# Patient Record
Sex: Female | Born: 1940 | Race: White | Hispanic: No | Marital: Married | Smoking: Former smoker
Health system: Southern US, Community
[De-identification: ages and names within clinical notes are randomized; demographics above are authoritative.]

## PROBLEM LIST (undated history)

## (undated) DIAGNOSIS — E079 Disorder of thyroid, unspecified: Secondary | ICD-10-CM

## (undated) DIAGNOSIS — K8020 Calculus of gallbladder without cholecystitis without obstruction: Secondary | ICD-10-CM

## (undated) DIAGNOSIS — E785 Hyperlipidemia, unspecified: Secondary | ICD-10-CM

## (undated) DIAGNOSIS — I10 Essential (primary) hypertension: Secondary | ICD-10-CM

## (undated) HISTORY — DX: Hyperlipidemia, unspecified: E78.5

## (undated) HISTORY — DX: Calculus of gallbladder without cholecystitis without obstruction: K80.20

## (undated) HISTORY — PX: CORONARY ANGIOPLASTY WITH STENT PLACEMENT: SHX49

---

## 2010-11-06 ENCOUNTER — Emergency Department (HOSPITAL_BASED_OUTPATIENT_CLINIC_OR_DEPARTMENT_OTHER)
Admission: EM | Admit: 2010-11-06 | Discharge: 2010-11-06 | Disposition: A | Discharge status: Home / Self Care | Payer: Medicare Other | Attending: Emergency Medicine | Admitting: Emergency Medicine

## 2010-11-06 ENCOUNTER — Emergency Department (INDEPENDENT_AMBULATORY_CARE_PROVIDER_SITE_OTHER): Payer: Medicare Other

## 2010-11-06 DIAGNOSIS — Z79899 Other long term (current) drug therapy: Secondary | ICD-10-CM | POA: Insufficient documentation

## 2010-11-06 DIAGNOSIS — I10 Essential (primary) hypertension: Secondary | ICD-10-CM | POA: Insufficient documentation

## 2010-11-06 DIAGNOSIS — W010XXA Fall on same level from slipping, tripping and stumbling without subsequent striking against object, initial encounter: Secondary | ICD-10-CM | POA: Insufficient documentation

## 2010-11-06 DIAGNOSIS — T148XXA Other injury of unspecified body region, initial encounter: Secondary | ICD-10-CM

## 2010-11-06 DIAGNOSIS — M79609 Pain in unspecified limb: Secondary | ICD-10-CM

## 2010-11-06 DIAGNOSIS — W19XXXA Unspecified fall, initial encounter: Secondary | ICD-10-CM

## 2010-11-06 HISTORY — DX: Essential (primary) hypertension: I10

## 2010-11-06 NOTE — ED Provider Notes (Signed)
History     CSN: [id number] Arrival date & time: 11/06/2010 10:28 AM   First MD Initiated Contact with Patient 11/06/10 1104      Chief Complaint  Patient presents with  . Fall    (Consider location/radiation/quality/duration/timing/severity/associated sxs/prior treatment) Patient is a 70 y.o. female presenting with fall. The history is provided by the patient.  Fall Incident onset: 6 days ago. The fall occurred while walking (tripped over the dog and hit her arm on something). Distance fallen: ground level. She landed on grass. Point of impact: right arm. Pain location: right arm. The pain is at a severity of 1/10. The pain is mild. She was ambulatory at the scene. Pertinent negatives include no numbness and no tingling. Exacerbated by: nothing. She has tried ice for the symptoms. The treatment provided moderate relief.    Past Medical History  Diagnosis Date  . Hypertension     Past Surgical History  Procedure Date  . Coronary angioplasty with stent placement in 2002    History reviewed. No pertinent family history.  History  Substance Use Topics  . Smoking status: Former Smoker    Quit date: 10/06/1995  . Smokeless tobacco: Never Used  . Alcohol Use: No    OB History    Grav Para Term Preterm Abortions TAB SAB Ect Mult Living                  Review of Systems  Neurological: Negative for tingling and numbness.  All other systems reviewed and are negative.    Allergies  Review of patient's allergies indicates no known allergies.  Home Medications   Current Outpatient Rx  Name Route Sig Dispense Refill  . ASPIRIN 81 MG PO CHEW Oral Chew 81 mg by mouth daily.      Marland Kitchen HYDROCHLOROTHIAZIDE 25 MG PO TABS Oral Take 12.5 mg by mouth daily.      Marland Kitchen LEVOTHYROXINE SODIUM 50 MCG PO TABS Oral Take 50 mcg by mouth daily.      Marland Kitchen LORAZEPAM 1 MG PO TABS Oral Take 0.5 mg by mouth at bedtime as needed. For sleep     . METOPROLOL TARTRATE 50 MG PO TABS Oral Take 50 mg by  mouth 2 (two) times daily.      Marland Kitchen ROSUVASTATIN CALCIUM 10 MG PO TABS Oral Take 10 mg by mouth.        BP 183/83  Pulse 72  Temp(Src) 97.8 F (36.6 C) (Oral)  Resp 20  SpO2 98%  Physical Exam  Nursing note and vitals reviewed. Constitutional: She is oriented to person, place, and time. She appears well-developed and well-nourished. No distress.  HENT:  Head: Normocephalic and atraumatic.  Eyes: EOM are normal. Pupils are equal, round, and reactive to light.  Musculoskeletal: Normal range of motion.       Right elbow: no tenderness found.       Arms: Neurological: She is alert and oriented to person, place, and time.  Skin: Skin is warm and dry. No rash noted.  Psychiatric: She has a normal mood and affect. Her behavior is normal.    ED Course  Procedures (including critical care time)  Labs Reviewed - No data to display Dg Forearm Right  11/06/2010  *RADIOLOGY REPORT*  Clinical Data: Larey Seat several days ago with pain  RIGHT FOREARM - 2 VIEW  Comparison: None.  Findings: The radius and ulna are in normal position. Slight irregularity of the distal right radius may be due to prior trauma  with healing with some degenerative change at the radiocarpal joint space as well.  No acute fracture is seen.  No elbow joint effusion is noted.  IMPRESSION: Negative.  Original Report Authenticated By: Juline Patch, M.D.     1. Hematoma       MDM   Pt with hematoma and large area of ecchymosis over the right forearm.  No pain over the elbow, wrist or shoulder.  Films neg.  Findings discussed with pt.        Gwyneth Sprout, MD 11/06/10 1404

## 2010-11-06 NOTE — ED Notes (Signed)
Pt fell on Tuesday October 30th injuring right forearm. Pt has bruised area approximately 2 inches by 3 inches. Has gold ball sized area of swelling.  States that she put ice on the area.  Denies any difficulty using arm.  Has full mobility of fingers.

## 2014-07-09 DIAGNOSIS — E785 Hyperlipidemia, unspecified: Secondary | ICD-10-CM | POA: Insufficient documentation

## 2014-07-09 DIAGNOSIS — R079 Chest pain, unspecified: Secondary | ICD-10-CM | POA: Insufficient documentation

## 2015-04-29 DIAGNOSIS — E785 Hyperlipidemia, unspecified: Secondary | ICD-10-CM | POA: Insufficient documentation

## 2015-04-29 DIAGNOSIS — Z0000 Encounter for general adult medical examination without abnormal findings: Secondary | ICD-10-CM | POA: Insufficient documentation

## 2015-04-29 DIAGNOSIS — F5104 Psychophysiologic insomnia: Secondary | ICD-10-CM | POA: Insufficient documentation

## 2017-01-19 ENCOUNTER — Other Ambulatory Visit: Payer: Self-pay

## 2017-01-19 ENCOUNTER — Encounter (HOSPITAL_BASED_OUTPATIENT_CLINIC_OR_DEPARTMENT_OTHER): Payer: Self-pay | Admitting: Emergency Medicine

## 2017-01-19 ENCOUNTER — Emergency Department (HOSPITAL_BASED_OUTPATIENT_CLINIC_OR_DEPARTMENT_OTHER)
Admission: EM | Admit: 2017-01-19 | Discharge: 2017-01-19 | Disposition: A | Discharge status: Home / Self Care | Payer: Medicare Other | Attending: Emergency Medicine | Admitting: Emergency Medicine

## 2017-01-19 ENCOUNTER — Emergency Department (HOSPITAL_BASED_OUTPATIENT_CLINIC_OR_DEPARTMENT_OTHER): Payer: Medicare Other

## 2017-01-19 DIAGNOSIS — Z955 Presence of coronary angioplasty implant and graft: Secondary | ICD-10-CM | POA: Insufficient documentation

## 2017-01-19 DIAGNOSIS — Z79899 Other long term (current) drug therapy: Secondary | ICD-10-CM | POA: Insufficient documentation

## 2017-01-19 DIAGNOSIS — M545 Low back pain, unspecified: Secondary | ICD-10-CM

## 2017-01-19 DIAGNOSIS — R1032 Left lower quadrant pain: Secondary | ICD-10-CM | POA: Insufficient documentation

## 2017-01-19 DIAGNOSIS — I10 Essential (primary) hypertension: Secondary | ICD-10-CM | POA: Diagnosis not present

## 2017-01-19 DIAGNOSIS — Z7982 Long term (current) use of aspirin: Secondary | ICD-10-CM | POA: Insufficient documentation

## 2017-01-19 DIAGNOSIS — Z87891 Personal history of nicotine dependence: Secondary | ICD-10-CM | POA: Insufficient documentation

## 2017-01-19 HISTORY — DX: Disorder of thyroid, unspecified: E07.9

## 2017-01-19 MED ORDER — HYDROCODONE-ACETAMINOPHEN 5-325 MG PO TABS
1.0000 | ORAL_TABLET | Freq: Once | ORAL | Status: AC
  Administered 2017-01-19: 1 via ORAL
  Filled 2017-01-19: qty 1

## 2017-01-19 MED ORDER — LIDOCAINE 5 % EX PTCH: 1.0000 | MEDICATED_PATCH | CUTANEOUS | 0 refills | Status: DC

## 2017-01-19 MED ORDER — HYDROCODONE-ACETAMINOPHEN 5-325 MG PO TABS: 1.0000 | ORAL_TABLET | Freq: Four times a day (QID) | ORAL | 0 refills | Status: DC | PRN

## 2017-01-19 MED ORDER — ACETAMINOPHEN 325 MG PO TABS
650.0000 mg | ORAL_TABLET | Freq: Once | ORAL | Status: DC
  Filled 2017-01-19 (×2): qty 2

## 2017-01-19 MED ORDER — DICLOFENAC SODIUM 1 % TD GEL: 2.0000 g | Freq: Four times a day (QID) | TRANSDERMAL | 0 refills | Status: DC

## 2017-01-19 NOTE — ED Notes (Signed)
Patient transported to X-ray 

## 2017-01-19 NOTE — ED Triage Notes (Signed)
L lower back pain radiating down L leg x 3 days. Denies injury.  

## 2017-01-19 NOTE — ED Provider Notes (Signed)
[facility] EMERGENCY DEPARTMENT Provider Note   CSN: [id number] Arrival date & time: 01/19/17  1321     History   Chief Complaint Chief Complaint  Patient presents with  . Back Pain    HPI Sandy Summers is a 77 y.o. female.  HPI   77 year old female presents today with complaints of lower back pain.  Patient notes symptoms started 2 days ago with left lower back pain.  She denies any preceding symptoms, denies any trauma, heavy lifting, or increased activity.  Patient notes this is located in the left lower lumbar region and upper gluteus.  She notes that sitting down makes symptoms worse, she notes that she has improvement in symptoms with standing and ambulation.  She notes that when she ambulates that she feels as if her leg he is going to give out from time to time, denies any loss of distal sensation strength and motor function.  Patient denies any abdominal pain, chest pain, shortness of breath, fever, dysuria.  Patient notes she had similar symptoms approximately 1 month ago that went away without intervention.  Patient reports she has not tried any medications for this.  Patient notes approximately 5 years ago she suffered a compression fracture to her lower back.  Past Medical History:  Diagnosis Date  . Hypertension   . Thyroid disease     There are no active problems to display for this patient.   Past Surgical History:  Procedure Laterality Date  . CORONARY ANGIOPLASTY WITH STENT PLACEMENT  in 2002    OB History    No data available       Home Medications    Prior to Admission medications   Medication Sig Start Date End Date Taking? Authorizing Provider  hydrochlorothiazide (HYDRODIURIL) 25 MG tablet Take 12.5 mg by mouth daily.     Yes [provider]  levothyroxine (SYNTHROID, LEVOTHROID) 50 MCG tablet Take 50 mcg by mouth daily.     Yes [provider]  lisinopril (PRINIVIL,ZESTRIL) 20 MG tablet Take 5 mg by mouth daily.     Yes [provider]  LORazepam (ATIVAN) 1 MG tablet Take 0.5 mg by mouth at bedtime as needed. For sleep    Yes [provider]  metoprolol (LOPRESSOR) 50 MG tablet Take 50 mg by mouth 2 (two) times daily.     Yes [provider]  rosuvastatin (CRESTOR) 10 MG tablet Take 10 mg by mouth.     Yes [provider]  aspirin 81 MG chewable tablet Chew 81 mg by mouth daily.      [provider]  diclofenac sodium (VOLTAREN) 1 % GEL Apply 2 g topically 4 (four) times daily. 01/19/17   Shavaughn Seidl, Tinnie Gens, PA-C  HYDROcodone-acetaminophen (NORCO/VICODIN) 5-325 MG tablet Take 1 tablet by mouth every 6 (six) hours as needed. 01/19/17   Anelly Samarin, Tinnie Gens, PA-C  lidocaine (LIDODERM) 5 % Place 1 patch onto the skin daily. Remove & Discard patch within 12 hours or as directed by MD 01/19/17   Eyvonne Mechanic, PA-C    Family History No family history on file.  Social History Social History   Tobacco Use  . Smoking status: Former Smoker    Last attempt to quit: 10/06/1995    Years since quitting: 21.3  . Smokeless tobacco: Never Used  Substance Use Topics  . Alcohol use: No  . Drug use: No     Allergies   Patient has no known allergies.   Review of Systems Review  of Systems  All other systems reviewed and are negative.    Physical Exam Updated Vital Signs BP (!) 154/80 (BP Location: Left Arm)   Pulse 78   Temp 98.5 F (36.9 C) (Oral)   Resp 18   Ht 4\' 11"  (1.499 m)   Wt 49 kg (108 lb)   SpO2 99%   BMI 21.81 kg/m   Physical Exam  Constitutional: She is oriented to person, place, and time. She appears well-developed and well-nourished.  HENT:  Head: Normocephalic and atraumatic.  Eyes: Conjunctivae are normal. Pupils are equal, round, and reactive to light. Right eye exhibits no discharge. Left eye exhibits no discharge. No scleral icterus.  Neck: Normal range of motion. No JVD present. No tracheal deviation present.  Pulmonary/Chest: Effort  normal. No stridor.  Abdominal: Soft. She exhibits no distension and no mass. There is no tenderness. There is no rebound and no guarding. No hernia.  Musculoskeletal:  No CT or L-spine tenderness, tenderness palpation of left lateral lumbar region and upper gluteus-full active range of motion of bilateral lower extremities, sensation intact, strength 5 out of 5, patellar reflexes 2+ bilateral  Neurological: She is alert and oriented to person, place, and time. Coordination normal.  Psychiatric: She has a normal mood and affect. Her behavior is normal. Judgment and thought content normal.  Nursing note and vitals reviewed.    ED Treatments / Results  Labs (all labs ordered are listed, but only abnormal results are displayed) Labs Reviewed - No data to display  EKG  EKG Interpretation None       Radiology Dg Lumbar Spine Complete  Result Date: 01/19/2017 CLINICAL DATA:  Left lower back pain for 3 days. History of compression fracture. EXAM: LUMBAR SPINE - COMPLETE 4+ VIEW COMPARISON:  MRI of the lumbosacral spine dated 04/25/2012 is not available for review. FINDINGS: Five non rib-bearing vertebral bodies. Age-indeterminate compression deformity of L4 vertebral body with approximately 20% height loss. 4 mm anterolisthesis of L4 on L5. Multilevel osteoarthritic changes with moderate joint space narrowing, endplate sclerosis and remodeling of vertebral bodies, and advanced posterior facet arthropathy at all lumbosacral levels. 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 IMPRESSION: Age-indeterminate compression deformity of L4 vertebral body with approximately 20% height loss. 4 mm anterolisthesis of L4 on L5. 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 Electronically Signed   By: Ted Mcalpineobrinka  Dimitrova M.D.   On: 01/19/2017 16:40    Procedures Procedures (including critical care time)  Medications Ordered in ED Medications  acetaminophen (TYLENOL) tablet 650 mg  (650 mg Oral Not Given 01/19/17 1605)  HYDROcodone-acetaminophen (NORCO/VICODIN) 5-325 MG per tablet 1 tablet (not administered)     Initial Impression / Assessment and Plan / ED Course  I have reviewed the triage vital signs and the nursing notes.  Pertinent labs & imaging results that were available during my care of the patient were reviewed by me and considered in my medical decision making (see chart for details).      Final Clinical Impressions(s) / ED Diagnoses   Final diagnoses:  Acute left-sided low back pain without sciatica    Labs:   Imaging: DG Lumbar  Consults:  Therapeutics:   Discharge Meds: lidoderm, Voltaren gel, norco   Assessment/Plan: 77 year old female presents today with lower back pain.  Patient has no acute findings on her plain films here.  She has no distal neurological deficits, no abdominal pain or systemic symptoms.  Patient will be treated symptomatically, encouraged follow-up with the primary care  for reevaluation and given strict return precautions.  She verbalized understanding and agreement to today's plan had no further questions or concerns.   ED Discharge Orders        Ordered    HYDROcodone-acetaminophen (NORCO/VICODIN) 5-325 MG tablet  Every 6 hours PRN     01/19/17 1659    lidocaine (LIDODERM) 5 %  Every 24 hours     01/19/17 1659    diclofenac sodium (VOLTAREN) 1 % GEL  4 times daily     01/19/17 1659       Shernell Saldierna, Tinnie Gens, PA-C 01/19/17 1701    Gwyneth Sprout, MD 01/21/17 1531

## 2017-01-19 NOTE — Discharge Instructions (Signed)
Please read attached information. If you experience any new or worsening signs or symptoms please return to the emergency room for evaluation. Please follow-up with your primary care provider or specialist as discussed. Please use medication prescribed only as directed and discontinue taking if you have any concerning signs or symptoms.   °

## 2017-06-11 DIAGNOSIS — K5730 Diverticulosis of large intestine without perforation or abscess without bleeding: Secondary | ICD-10-CM | POA: Insufficient documentation

## 2019-01-28 ENCOUNTER — Ambulatory Visit: Payer: PRIVATE HEALTH INSURANCE

## 2019-02-06 ENCOUNTER — Ambulatory Visit: Payer: Medicare Other | Attending: Internal Medicine

## 2019-02-06 DIAGNOSIS — Z23 Encounter for immunization: Secondary | ICD-10-CM | POA: Insufficient documentation

## 2019-02-06 NOTE — Progress Notes (Signed)
   Covid-19 Vaccination Clinic  Name:  AANYA HAYNES    MRN: [id number] DOB: 03-16-1940  02/06/2019  Ms. Ditullio was observed post Covid-19 immunization for 15 minutes without incidence. She was provided with Vaccine Information Sheet and instruction to access the V-Safe system.   Ms. Yaeger was instructed to call 911 with any severe reactions post vaccine: Marland Kitchen Difficulty breathing  . Swelling of your face and throat  . A fast heartbeat  . A bad rash all over your body  . Dizziness and weakness    Immunizations Administered    Name Date Dose VIS Date Route   Pfizer COVID-19 Vaccine 02/06/2019  9:13 AM 0.3 mL 12/12/2018 Intramuscular   Manufacturer: [employer], [employer]   Lot: [id number]   NDC: [address]-0

## 2019-03-03 ENCOUNTER — Ambulatory Visit: Payer: Medicare Other | Attending: Internal Medicine

## 2019-03-03 DIAGNOSIS — Z23 Encounter for immunization: Secondary | ICD-10-CM | POA: Insufficient documentation

## 2019-03-03 NOTE — Progress Notes (Signed)
   Covid-19 Vaccination Clinic  Name:  Sandy Summers    MRN: [id number] DOB: [birth year]  03/03/2019  [name] was observed post Covid-19 immunization for 15 minutes without incident. She was provided with Vaccine Information Sheet and instruction to access the V-Safe system.   [name] was instructed to call 911 with any severe reactions post vaccine: Marland Kitchen Difficulty breathing  . Swelling of face and throat  . A fast heartbeat  . A bad rash all over body  . Dizziness and weakness   Immunizations Administered    Name Date Dose VIS Date Route   Pfizer COVID-19 Vaccine 03/03/2019  9:43 AM 0.3 mL 12/12/2018 Intramuscular   Manufacturer: [employer], [employer]   Lot: [id number]   NDC: [address]-0

## 2019-03-20 ENCOUNTER — Ambulatory Visit (INDEPENDENT_AMBULATORY_CARE_PROVIDER_SITE_OTHER): Payer: Medicare Other | Admitting: Physician Assistant

## 2019-03-20 ENCOUNTER — Encounter: Payer: Self-pay | Admitting: Physician Assistant

## 2019-03-20 ENCOUNTER — Other Ambulatory Visit: Payer: Self-pay

## 2019-03-20 DIAGNOSIS — L309 Dermatitis, unspecified: Secondary | ICD-10-CM | POA: Diagnosis not present

## 2019-03-20 MED ORDER — TRIAMCINOLONE ACETONIDE 0.1 % EX CREA: 1.0000 "application " | TOPICAL_CREAM | Freq: Two times a day (BID) | CUTANEOUS | 2 refills | Status: DC | PRN

## 2019-03-20 NOTE — Progress Notes (Signed)
   New Patient Visit  Subjective  Sandy Summers is a 79 y.o. female who presents for the following: Pruritis (She states that she has all over itching for about 6 months.  PCP put her on Prednisone and she saw improvement for about 2 weeks and then it came back. Also, tried Hydroxyzine but it "knocked her out."  Cannot pinpoint any certain products, does have 2 dogs and 2 cats and her spouse is not itching..Chest has had a rash, lower legs has a rash. It moves around. Some areas have cleared while new areas flare up. Has never been on the face.    Objective  Well appearing patient in no apparent distress; mood and affect are within normal limits.  A focused examination was performed including arms, chest, axillae, abdomen, back, legs and buttocks. Relevant physical exam findings are noted in the Assessment and Plan. No suspicious moles noted on back.  Objective  Left Breast, Left Lower Leg - Anterior, Left Lower Leg - Posterior, Left Upper Back, Right Breast, Right Forearm - Anterior, Right Upper Back: Thin scaly erythematous plaques. Patches noted right wrist, left lower leg and upper back. Entire chest and breasts appear to be erythematous and scaling.  Assessment & Plan  Dermatitis (7) Right Forearm - Anterior; Left Lower Leg - Anterior; Left Lower Leg - Posterior; Left Breast; Right Breast; Left Upper Back; Right Upper Back  She is going to use All free and clear and skip dryer sheets. She is going to stop her lubriderm lotion and use cerave.   triamcinolone cream (KENALOG) 0.1 % - Left Breast, Left Lower Leg - Anterior, Left Lower Leg - Posterior, Left Upper Back, Right Breast, Right Forearm - Anterior, Right Upper Back

## 2019-03-23 ENCOUNTER — Telehealth: Payer: Self-pay | Admitting: Physician Assistant

## 2019-03-23 NOTE — Telephone Encounter (Signed)
Advised pt that metoprolol could make her itch. She has been taking it for years and I advised her not to make any changes at this time. We will see if she improves with other changes and if not she can talk to her PCP about it in the future.

## 2019-04-20 ENCOUNTER — Other Ambulatory Visit: Payer: Self-pay

## 2019-04-20 ENCOUNTER — Ambulatory Visit (INDEPENDENT_AMBULATORY_CARE_PROVIDER_SITE_OTHER): Payer: Medicare Other | Admitting: Physician Assistant

## 2019-04-20 ENCOUNTER — Encounter: Payer: Self-pay | Admitting: Physician Assistant

## 2019-04-20 DIAGNOSIS — L309 Dermatitis, unspecified: Secondary | ICD-10-CM

## 2019-04-20 MED ORDER — BETAMETHASONE DIPROPIONATE 0.05 % EX CREA
TOPICAL_CREAM | Freq: Two times a day (BID) | CUTANEOUS | 11 refills | Status: DC | PRN
Start: 2019-04-20 — End: 2020-11-18

## 2019-04-20 NOTE — Progress Notes (Signed)
   Follow up Visit  Subjective  Sandy Summers is a 79 y.o. female who presents for the following: Follow-up (Patient here today for follow up on dermatitis.  Patient states that she's had a lot of improvement.  There's only one place still itching per patient and that's on her left lower leg.). She no longer itches on her chest or back or arms. She changed laundry detergent. She stopped fabric softener, and dryer sheets and she is using All free and clear.   Objective  Well appearing patient in no apparent distress; mood and affect are within normal limits.  A focused examination was performed including chest, lower legs, arms, back and abdomen. Relevant physical exam findings are noted in the Assessment and Plan.   Objective  Left Lower Leg - Anterior: Xerotic skin noted left lower leg with some erythema and lichenoid papules.  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 She no longer itches anywhere except this left lower leg. She is going to continue to keep her laundry changes and to stay away from lubrider and use cerave. She will do the Betamethasone to her lower leg bid for two weeks with moist wrap and will call if it doesn't improve.  betamethasone dipropionate 0.05 % cream - Left Lower Leg - Anterior  Other Related Medications triamcinolone cream (KENALOG) 0.1 %

## 2019-06-25 DIAGNOSIS — M810 Age-related osteoporosis without current pathological fracture: Secondary | ICD-10-CM | POA: Insufficient documentation

## 2020-04-29 ENCOUNTER — Emergency Department (HOSPITAL_BASED_OUTPATIENT_CLINIC_OR_DEPARTMENT_OTHER): Payer: Medicare Other

## 2020-04-29 ENCOUNTER — Other Ambulatory Visit: Payer: Self-pay

## 2020-04-29 ENCOUNTER — Emergency Department (HOSPITAL_BASED_OUTPATIENT_CLINIC_OR_DEPARTMENT_OTHER)
Admission: EM | Admit: 2020-04-29 | Discharge: 2020-04-29 | Disposition: A | Discharge status: Home / Self Care | Payer: Medicare Other | Attending: Emergency Medicine | Admitting: Emergency Medicine

## 2020-04-29 ENCOUNTER — Encounter (HOSPITAL_BASED_OUTPATIENT_CLINIC_OR_DEPARTMENT_OTHER): Payer: Self-pay | Admitting: Emergency Medicine

## 2020-04-29 DIAGNOSIS — Z79899 Other long term (current) drug therapy: Secondary | ICD-10-CM | POA: Diagnosis not present

## 2020-04-29 DIAGNOSIS — Z951 Presence of aortocoronary bypass graft: Secondary | ICD-10-CM | POA: Diagnosis not present

## 2020-04-29 DIAGNOSIS — K30 Functional dyspepsia: Secondary | ICD-10-CM | POA: Insufficient documentation

## 2020-04-29 DIAGNOSIS — I10 Essential (primary) hypertension: Secondary | ICD-10-CM | POA: Diagnosis not present

## 2020-04-29 DIAGNOSIS — Z87891 Personal history of nicotine dependence: Secondary | ICD-10-CM | POA: Diagnosis not present

## 2020-04-29 DIAGNOSIS — Z7982 Long term (current) use of aspirin: Secondary | ICD-10-CM | POA: Diagnosis not present

## 2020-04-29 LAB — BASIC METABOLIC PANEL
Anion gap: 11 (ref 5–15)
BUN: 27 mg/dL — ABNORMAL HIGH (ref 8–23)
CO2: 23 mmol/L (ref 22–32)
Calcium: 8.8 mg/dL — ABNORMAL LOW (ref 8.9–10.3)
Chloride: 103 mmol/L (ref 98–111)
Creatinine, Ser: 1.39 mg/dL — ABNORMAL HIGH (ref 0.44–1.00)
GFR, Estimated: 39 mL/min — ABNORMAL LOW (ref >60)
Glucose, Bld: 119 mg/dL — ABNORMAL HIGH (ref 70–99)
Potassium: 3.9 mmol/L (ref 3.5–5.1)
Sodium: 137 mmol/L (ref 135–145)

## 2020-04-29 LAB — CBC
HCT: 37.1 % (ref 36.0–46.0)
Hemoglobin: 11.9 g/dL — ABNORMAL LOW (ref 12.0–15.0)
MCH: 30.8 pg (ref 26.0–34.0)
MCHC: 32.1 g/dL (ref 30.0–36.0)
MCV: 96.1 fL (ref 80.0–100.0)
Platelets: 260 10*3/uL (ref 150–400)
RBC: 3.86 MIL/uL — ABNORMAL LOW (ref 3.87–5.11)
RDW: 14.2 % (ref 11.5–15.5)
WBC: 4.4 10*3/uL (ref 4.0–10.5)
nRBC: 0 % (ref 0.0–0.2)

## 2020-04-29 LAB — TROPONIN I (HIGH SENSITIVITY): Troponin I (High Sensitivity): 2 ng/L (ref <18)

## 2020-04-29 NOTE — ED Triage Notes (Signed)
Pt presents to ED POV. Pt c/o severe indigestion. Pt reports that after she was done eating she had sever indigestion aprox 87m and emesis x3-4. Pt had heart stents 20y ago

## 2020-04-29 NOTE — Discharge Instructions (Addendum)
Return if any problems.

## 2020-04-29 NOTE — ED Provider Notes (Signed)
[facility] EMERGENCY DEPARTMENT Provider Note   CSN: [id number] Arrival date & time: 04/29/20  1625     History Chief Complaint  Patient presents with  . Gastroesophageal Reflux    Sandy Summers is a 80 y.o. female.  Pt reports she had an episode of indigestion today.  Pt reports she had several episodes of  Belching and symptoms resolved.  Pt reports she  had a stent placed 20 years ago.  Pt concerned that pain could be from her heart   The history is provided by the patient. No language interpreter was used.  Gastroesophageal Reflux This is a new problem. The current episode started 1 to 2 hours ago. The problem has been resolved. Pertinent negatives include no chest pain and no abdominal pain. Nothing aggravates the symptoms. Nothing relieves the symptoms. She has tried nothing for the symptoms. The treatment provided no relief.       Past Medical History:  Diagnosis Date  . Hypertension   . Thyroid disease     There are no problems to display for this patient.   Past Surgical History:  Procedure Laterality Date  . CORONARY ANGIOPLASTY WITH STENT PLACEMENT  in 2002     OB History   No obstetric history on file.     History reviewed. No pertinent family history.  Social History   Tobacco Use  . Smoking status: Former Smoker    Quit date: 10/06/1995    Years since quitting: 24.5  . Smokeless tobacco: Never Used  Substance Use Topics  . Alcohol use: No  . Drug use: No    Home Medications Prior to Admission medications   Medication Sig Start Date End Date Taking? Authorizing Provider  aspirin 81 MG chewable tablet Chew 81 mg by mouth daily.      [provider]  atorvastatin (LIPITOR) 20 MG tablet Take 20 mg by mouth daily.    [provider]  betamethasone dipropionate 0.05 % cream Apply topically 2 (two) times daily as needed (Rash). 04/20/19   Clark-Burning, Victorino Dike, PA-C  levothyroxine (SYNTHROID, LEVOTHROID) 50 MCG  tablet Take 50 mcg by mouth daily.      [provider]  lisinopril (PRINIVIL,ZESTRIL) 20 MG tablet Take 5 mg by mouth daily.     [provider]  LORazepam (ATIVAN) 1 MG tablet Take 0.5 mg by mouth at bedtime as needed. For sleep     [provider]  metoprolol succinate (TOPROL-XL) 50 MG 24 hr tablet Take 50 mg by mouth at bedtime. 03/01/19   [provider]  triamcinolone cream (KENALOG) 0.1 % Apply 1 application topically 2 (two) times daily as needed. Apply to affected area twice a day as directed. 03/20/19   Clark-Burning, Victorino Dike, PA-C    Allergies    Alendronate, Amoxicillin-pot clavulanate, Denosumab, and Gabapentin  Review of Systems   Review of Systems  Cardiovascular: Negative for chest pain.  Gastrointestinal: Negative for abdominal pain.  All other systems reviewed and are negative.   Physical Exam Updated Vital Signs BP (!) 142/68   Pulse 76   Temp 98.9 F (37.2 C) (Oral)   Resp 16   Ht 4\' 11"  (1.499 m)   Wt 51.1 kg   SpO2 98%   BMI 22.74 kg/m   Physical Exam Vitals and nursing note reviewed.  Constitutional:      Appearance: She is well-developed.  HENT:     Head: Normocephalic.  Cardiovascular:     Rate and Rhythm: Normal  rate and regular rhythm.     Pulses: Normal pulses.  Pulmonary:     Effort: Pulmonary effort is normal.  Abdominal:     General: Abdomen is flat. There is no distension.  Musculoskeletal:        General: Normal range of motion.     Cervical back: Normal range of motion.  Skin:    General: Skin is warm.  Neurological:     General: No focal deficit present.     Mental Status: She is alert and oriented to person, place, and time.  Psychiatric:        Mood and Affect: Mood normal.     ED Results / Procedures / Treatments   Labs (all labs ordered are listed, but only abnormal results are displayed) Labs Reviewed  BASIC METABOLIC PANEL - Abnormal; Notable for the following components:       Result Value   Glucose, Bld 119 (*)    BUN 27 (*)    Creatinine, Ser 1.39 (*)    Calcium 8.8 (*)    GFR, Estimated 39 (*)    All other components within normal limits  CBC - Abnormal; Notable for the following components:   RBC 3.86 (*)    Hemoglobin 11.9 (*)    All other components within normal limits  TROPONIN I (HIGH SENSITIVITY)  TROPONIN I (HIGH SENSITIVITY)    EKG EKG Interpretation  Date/Time:  Friday April 29 2020 16:35:30 EDT Ventricular Rate:  79 PR Interval:  171 QRS Duration: 85 QT Interval:  374 QTC Calculation: 429 R Axis:   -38 Text Interpretation: Sinus rhythm Low voltage, precordial leads Abnormal R-wave progression, late transition Left ventricular hypertrophy Confirmed by Cherlynn Perches ([id number]) on 04/29/2020 4:40:30 PM   Radiology DG Chest 2 View  Result Date: 04/29/2020 CLINICAL DATA:  Severe indigestion. EXAM: CHEST - 2 VIEW COMPARISON:  None. FINDINGS: The heart size and mediastinal contours are within normal limits. Both lungs are clear. The visualized skeletal structures are unremarkable. IMPRESSION: No active cardiopulmonary disease. Electronically Signed   By: Elige Ko   On: 04/29/2020 17:54    Procedures Procedures   Medications Ordered in ED Medications - No data to display  ED Course  I have reviewed the triage vital signs and the nursing notes.  Pertinent labs & imaging results that were available during my care of the patient were reviewed by me and considered in my medical decision making (see chart for details).    MDM Rules/Calculators/A&P                          MDM: EKG no acute abnormality, troponin x1 is negative.  Pt denies any current symptoms.  Pt does not want second troponin.  Pt counseled on symptoms  Final Clinical Impression(s) / ED Diagnoses Final diagnoses:  Indigestion    Rx / DC Orders ED Discharge Orders    None    An After Visit Summary was printed and given to the patient.   Elson Areas,  [place] 04/29/20 1947    Sabino Donovan, MD 04/29/20 2325

## 2020-11-15 ENCOUNTER — Encounter (HOSPITAL_BASED_OUTPATIENT_CLINIC_OR_DEPARTMENT_OTHER): Payer: Self-pay | Admitting: Emergency Medicine

## 2020-11-15 ENCOUNTER — Other Ambulatory Visit: Payer: Self-pay

## 2020-11-15 ENCOUNTER — Emergency Department (HOSPITAL_BASED_OUTPATIENT_CLINIC_OR_DEPARTMENT_OTHER): Payer: Medicare Other

## 2020-11-15 ENCOUNTER — Inpatient Hospital Stay (HOSPITAL_BASED_OUTPATIENT_CLINIC_OR_DEPARTMENT_OTHER)
Admission: EM | Admit: 2020-11-15 | Discharge: 2020-11-18 | DRG: 481 | Disposition: A | Discharge status: Home / Self Care | Payer: Medicare Other | Attending: Internal Medicine | Admitting: Internal Medicine

## 2020-11-15 DIAGNOSIS — D62 Acute posthemorrhagic anemia: Secondary | ICD-10-CM | POA: Diagnosis not present

## 2020-11-15 DIAGNOSIS — Z87891 Personal history of nicotine dependence: Secondary | ICD-10-CM

## 2020-11-15 DIAGNOSIS — Z955 Presence of coronary angioplasty implant and graft: Secondary | ICD-10-CM | POA: Diagnosis not present

## 2020-11-15 DIAGNOSIS — S72002A Fracture of unspecified part of neck of left femur, initial encounter for closed fracture: Secondary | ICD-10-CM | POA: Diagnosis present

## 2020-11-15 DIAGNOSIS — Z880 Allergy status to penicillin: Secondary | ICD-10-CM

## 2020-11-15 DIAGNOSIS — I2510 Atherosclerotic heart disease of native coronary artery without angina pectoris: Secondary | ICD-10-CM | POA: Diagnosis present

## 2020-11-15 DIAGNOSIS — Z79899 Other long term (current) drug therapy: Secondary | ICD-10-CM

## 2020-11-15 DIAGNOSIS — T148XXA Other injury of unspecified body region, initial encounter: Secondary | ICD-10-CM

## 2020-11-15 DIAGNOSIS — W010XXA Fall on same level from slipping, tripping and stumbling without subsequent striking against object, initial encounter: Secondary | ICD-10-CM | POA: Diagnosis present

## 2020-11-15 DIAGNOSIS — D649 Anemia, unspecified: Secondary | ICD-10-CM | POA: Diagnosis present

## 2020-11-15 DIAGNOSIS — Z20822 Contact with and (suspected) exposure to covid-19: Secondary | ICD-10-CM | POA: Diagnosis present

## 2020-11-15 DIAGNOSIS — Z888 Allergy status to other drugs, medicaments and biological substances status: Secondary | ICD-10-CM | POA: Diagnosis not present

## 2020-11-15 DIAGNOSIS — S72012A Unspecified intracapsular fracture of left femur, initial encounter for closed fracture: Principal | ICD-10-CM | POA: Diagnosis present

## 2020-11-15 DIAGNOSIS — K5900 Constipation, unspecified: Secondary | ICD-10-CM | POA: Diagnosis present

## 2020-11-15 DIAGNOSIS — E785 Hyperlipidemia, unspecified: Secondary | ICD-10-CM | POA: Diagnosis present

## 2020-11-15 DIAGNOSIS — Z79890 Hormone replacement therapy (postmenopausal): Secondary | ICD-10-CM | POA: Diagnosis not present

## 2020-11-15 DIAGNOSIS — Z7982 Long term (current) use of aspirin: Secondary | ICD-10-CM | POA: Diagnosis not present

## 2020-11-15 DIAGNOSIS — M810 Age-related osteoporosis without current pathological fracture: Secondary | ICD-10-CM | POA: Diagnosis present

## 2020-11-15 DIAGNOSIS — I10 Essential (primary) hypertension: Secondary | ICD-10-CM | POA: Diagnosis present

## 2020-11-15 DIAGNOSIS — Z66 Do not resuscitate: Secondary | ICD-10-CM | POA: Diagnosis present

## 2020-11-15 DIAGNOSIS — E039 Hypothyroidism, unspecified: Secondary | ICD-10-CM | POA: Diagnosis present

## 2020-11-15 LAB — CBC WITH DIFFERENTIAL/PLATELET
Abs Immature Granulocytes: 0.02 10*3/uL (ref 0.00–0.07)
Basophils Absolute: 0 10*3/uL (ref 0.0–0.1)
Basophils Relative: 0 %
Eosinophils Absolute: 0.2 10*3/uL (ref 0.0–0.5)
Eosinophils Relative: 5 %
HCT: 35.3 % — ABNORMAL LOW (ref 36.0–46.0)
Hemoglobin: 11.5 g/dL — ABNORMAL LOW (ref 12.0–15.0)
Immature Granulocytes: 0 %
Lymphocytes Relative: 13 %
Lymphs Abs: 0.6 10*3/uL — ABNORMAL LOW (ref 0.7–4.0)
MCH: 30.4 pg (ref 26.0–34.0)
MCHC: 32.6 g/dL (ref 30.0–36.0)
MCV: 93.4 fL (ref 80.0–100.0)
Monocytes Absolute: 0.5 10*3/uL (ref 0.1–1.0)
Monocytes Relative: 12 %
Neutro Abs: 3.1 10*3/uL (ref 1.7–7.7)
Neutrophils Relative %: 70 %
Platelets: 275 10*3/uL (ref 150–400)
RBC: 3.78 MIL/uL — ABNORMAL LOW (ref 3.87–5.11)
RDW: 13.9 % (ref 11.5–15.5)
WBC: 4.5 10*3/uL (ref 4.0–10.5)
nRBC: 0 % (ref 0.0–0.2)

## 2020-11-15 LAB — RESP PANEL BY RT-PCR (FLU A&B, COVID) ARPGX2
Influenza A by PCR: NEGATIVE
Influenza B by PCR: NEGATIVE
SARS Coronavirus 2 by RT PCR: NEGATIVE

## 2020-11-15 LAB — BASIC METABOLIC PANEL
Anion gap: 8 (ref 5–15)
BUN: 20 mg/dL (ref 8–23)
CO2: 25 mmol/L (ref 22–32)
Calcium: 8.9 mg/dL (ref 8.9–10.3)
Chloride: 104 mmol/L (ref 98–111)
Creatinine, Ser: 0.84 mg/dL (ref 0.44–1.00)
GFR, Estimated: >60 mL/min (ref >60)
Glucose, Bld: 92 mg/dL (ref 70–99)
Potassium: 4.6 mmol/L (ref 3.5–5.1)
Sodium: 137 mmol/L (ref 135–145)

## 2020-11-15 MED ORDER — ATORVASTATIN CALCIUM 10 MG PO TABS
20.0000 mg | ORAL_TABLET | Freq: Every day | ORAL | Status: DC
  Administered 2020-11-17 – 2020-11-18 (×2): 20 mg via ORAL
  Filled 2020-11-15 (×4): qty 2

## 2020-11-15 MED ORDER — LEVOTHYROXINE SODIUM 50 MCG PO TABS
50.0000 ug | ORAL_TABLET | Freq: Every day | ORAL | Status: DC
  Administered 2020-11-16 – 2020-11-18 (×3): 50 ug via ORAL
  Filled 2020-11-15 (×3): qty 1

## 2020-11-15 MED ORDER — LISINOPRIL 5 MG PO TABS
5.0000 mg | ORAL_TABLET | Freq: Every day | ORAL | Status: DC
  Administered 2020-11-16 – 2020-11-18 (×3): 5 mg via ORAL
  Filled 2020-11-15 (×3): qty 1

## 2020-11-15 MED ORDER — MORPHINE SULFATE (PF) 2 MG/ML IV SOLN: 0.5000 mg | INTRAVENOUS | Status: DC | PRN

## 2020-11-15 MED ORDER — METOPROLOL SUCCINATE ER 50 MG PO TB24
50.0000 mg | ORAL_TABLET | Freq: Every day | ORAL | Status: DC
  Administered 2020-11-15 – 2020-11-17 (×3): 50 mg via ORAL
  Filled 2020-11-15 (×3): qty 1

## 2020-11-15 MED ORDER — HYDROCODONE-ACETAMINOPHEN 5-325 MG PO TABS
1.0000 | ORAL_TABLET | Freq: Four times a day (QID) | ORAL | Status: DC | PRN
  Filled 2020-11-15: qty 1
  Filled 2020-11-15: qty 2

## 2020-11-15 MED ORDER — VITAMIN D3 25 MCG (1000 UNIT) PO TABS
1000.0000 [IU] | ORAL_TABLET | Freq: Every day | ORAL | Status: DC
  Filled 2020-11-15 (×3): qty 1

## 2020-11-15 MED ORDER — SODIUM CHLORIDE 0.9 % IV SOLN: INTRAVENOUS | Status: AC

## 2020-11-15 NOTE — ED Provider Notes (Signed)
[facility] EMERGENCY DEPARTMENT Provider Note   CSN: [id number] Arrival date & time: 11/15/20  4888     History Chief Complaint  Patient presents with   Sandy Summers is a 80 y.o. female.  Patient fell about 5 or 6 days ago and landed on her left gluteal area.  She has a bruise to the left side of her hip.  Has had pain with ambulation since.  Did not hit her head or lose consciousness.  The history is provided by the patient.  Fall This is a new problem. The current episode started more than 2 days ago. The problem occurs daily. The problem has been gradually improving. Pertinent negatives include no chest pain, no abdominal pain, no headaches and no shortness of breath. The symptoms are aggravated by walking. Relieved by: otc meds. The treatment provided mild relief.      Past Medical History:  Diagnosis Date   Hypertension    Thyroid disease     There are no problems to display for this patient.   Past Surgical History:  Procedure Laterality Date   CORONARY ANGIOPLASTY WITH STENT PLACEMENT  in 2002     OB History   No obstetric history on file.     No family history on file.  Social History   Tobacco Use   Smoking status: Former    Types: Cigarettes    Quit date: 10/06/1995    Years since quitting: 25.1   Smokeless tobacco: Never  Substance Use Topics   Alcohol use: No   Drug use: No    Home Medications Prior to Admission medications   Medication Sig Start Date End Date Taking? Authorizing Provider  aspirin 81 MG chewable tablet Chew 81 mg by mouth daily.      [provider]  atorvastatin (LIPITOR) 20 MG tablet Take 20 mg by mouth daily.    [provider]  betamethasone dipropionate 0.05 % cream Apply topically 2 (two) times daily as needed (Rash). 04/20/19   Sandy Summers, Sandy Summers  levothyroxine (SYNTHROID, LEVOTHROID) 50 MCG tablet Take 50 mcg by mouth daily.      [provider]  lisinopril  (PRINIVIL,ZESTRIL) 20 MG tablet Take 5 mg by mouth daily.     [provider]  LORazepam (ATIVAN) 1 MG tablet Take 0.5 mg by mouth at bedtime as needed. For sleep     [provider]  metoprolol succinate (TOPROL-XL) 50 MG 24 hr tablet Take 50 mg by mouth at bedtime. 03/01/19   [provider]  triamcinolone cream (KENALOG) 0.1 % Apply 1 application topically 2 (two) times daily as needed. Apply to affected area twice a day as directed. 03/20/19   Sandy Summers, Sandy Summers    Allergies    Alendronate, Amoxicillin-pot clavulanate, Denosumab, and Gabapentin  Review of Systems   Review of Systems  Respiratory:  Negative for shortness of breath.   Cardiovascular:  Negative for chest pain.  Gastrointestinal:  Negative for abdominal pain.  Musculoskeletal:  Positive for arthralgias. Negative for back pain and gait problem.  Skin:  Positive for color change.  Neurological:  Negative for weakness, numbness and headaches.   Physical Exam Updated Vital Signs BP (!) 148/69 (BP Location: Right Arm)   Pulse 76   Temp 97.9 F (36.6 C) (Oral)   Resp 18   Ht 4\' 11"  (1.499 m)   Wt 48.5 kg   SpO2 100%   BMI 21.61 kg/m   Physical Exam  Constitutional:      General: She is not in acute distress.    Appearance: She is not ill-appearing.  Cardiovascular:     Pulses: Normal pulses.  Musculoskeletal:        General: Tenderness present. Normal range of motion.     Cervical back: Normal range of motion. No tenderness.     Comments: Tenderness over the left lateral hip  Skin:    Findings: Bruising present.     Comments: Bruising over the left trochanter/hip  Neurological:     General: No focal deficit present.     Mental Status: She is alert.     Sensory: No sensory deficit.     Motor: No weakness.     Comments: 5+ out of 5 strength throughout, normal sensation    ED Results / Procedures / Treatments   Labs (all labs ordered are listed, but only abnormal results  are displayed) Labs Reviewed  RESP PANEL BY RT-PCR (FLU A&B, COVID) [id number]  CBC WITH DIFFERENTIAL/PLATELET  BASIC METABOLIC PANEL    EKG None  Radiology DG Chest 1 View  Result Date: 11/15/2020 CLINICAL DATA:  Fall, left hip pain.  Reduced mobility. EXAM: CHEST  1 VIEW COMPARISON:  04/29/2020 FINDINGS: Atherosclerotic calcification of the aortic arch. Mild lower thoracic and upper lumbar spondylosis. The lungs appear clear. Heart size within normal limits. No blunting of the costophrenic angles. IMPRESSION: 1. No acute findings. 2. Atherosclerosis is present, including aortoiliac atherosclerotic disease. Electronically Signed   By: Gaylyn Rong M.D.   On: 11/15/2020 09:27   DG Hip Unilat With Pelvis 2-3 Views Left  Result Date: 11/15/2020 CLINICAL DATA:  Fall last week, left hip pain.  Reduced mobility. EXAM: DG HIP (WITH OR WITHOUT PELVIS) 2-3V LEFT COMPARISON:  Reduced mobility. FINDINGS: Acute mildly displaced subcapital femoral neck fracture on the left. Mild spurring of both acetabula. Atherosclerotic vascular calcifications noted. Bony demineralization is present. IMPRESSION: 1. Acute left subcapital femoral neck fracture. 2. Bilateral acetabular spurring. 3. Aortic Atherosclerosis (ICD10-I70.0). Common iliac artery atherosclerosis. Electronically Signed   By: Gaylyn Rong M.D.   On: 11/15/2020 09:25    Procedures Procedures   Medications Ordered in ED Medications - No data to display  ED Course  I have reviewed the triage vital signs and the nursing notes.  Pertinent labs & imaging results that were available during my care of the patient were reviewed by me and considered in my medical decision making (see chart for details).    MDM Rules/Calculators/A&P                           [name] is here with left hip pain after fall last week.  Bruising to the left hip.  Normal vitals.  She has been ambulatory.  Denies hitting her head or losing consciousness.   She is on a blood thinners.  X-rays do show left acute subcapital fracture of the femoral neck.  Talked with Dr. Sherilyn Dacosta with orthopedics.  We will have her admitted to hospitalist.  Medical screening labs and EKG have been ordered.  She is neuromuscularly neurovascularly intact.  This chart was dictated using voice recognition software.  Despite best efforts to proofread,  errors can occur which can change the documentation meaning.   Final Clinical Impression(s) / ED Diagnoses Final diagnoses:  Closed fracture of left hip, initial encounter [facility])    Rx / DC Orders ED Discharge Orders  None        Virgina Norfolk, DO 11/15/20 [phone]

## 2020-11-15 NOTE — H&P (Signed)
History and Physical    Sandy Summers EMV:[id number] DOB: Feb 17, 1940 DOA: 11/15/2020  PCP: Oneita Hurt, No Patient coming from: [facility] emergency department  Chief Complaint: Fall, left hip pain  HPI: Sandy Summers is a 80 y.o. female with medical history significant of hypertension, hyperlipidemia, hypothyroidism, CAD status post PCI in 2002, osteoporosis presented to the ED complaining of left hip pain after a mechanical fall last week.  Vital signs stable.  Labs showing WBC 4.5, hemoglobin 11.5, platelet count 275k.  Sodium 137, potassium 4.6, chloride 104, bicarb 25, BUN 20, creatinine 0.8, glucose 92.  Screening COVID and influenza PCR negative.  X-ray of left hip/pelvis showing acute left subcapital femoral neck fracture.  Chest x-ray negative for acute finding.  EKG without acute changes. Orthopedics (Dr. Sherilyn Dacosta) consulted, plan for surgery tomorrow.  Patient states she was taking her dogs out of the car and somehow got entangled in their leashes and fell on her left hip.  She did not injure her head.  States prior to this event today, she was doing very well.  She walks 4 miles daily and plays golf.  Denies any chest pain or shortness of breath with exertion.  Denies fevers, cough, nausea, vomiting, abdominal pain, diarrhea, dysuria, hematemesis, hematochezia, or melena.  States she had a colonoscopy done over 10 years ago and was told she had diverticulosis.  Review of Systems:  All systems reviewed and apart from history of presenting illness, are negative.  Past Medical History:  Diagnosis Date   Hypertension    Thyroid disease     Past Surgical History:  Procedure Laterality Date   CORONARY ANGIOPLASTY WITH STENT PLACEMENT  in 2002     reports that she quit smoking about 25 years ago. She has never used smokeless tobacco. She reports that she does not drink alcohol and does not use drugs.  Allergies  Allergen Reactions   Alendronate Nausea Only   Amoxicillin-Pot  Clavulanate Diarrhea   Denosumab Other (See Comments)    I just didn't like it    Gabapentin Other (See Comments)    Ineffective for sleep "made me crazy"    History reviewed. No pertinent family history.  Prior to Admission medications   Medication Sig Start Date End Date Taking? Authorizing Provider  aspirin 81 MG chewable tablet Chew 81 mg by mouth daily.     Yes [provider]  atorvastatin (LIPITOR) 20 MG tablet Take 20 mg by mouth daily.   Yes [provider]  Cholecalciferol 25 MCG (1000 UT) tablet Take 1,000 Units by mouth daily.   Yes [provider]  levothyroxine (SYNTHROID, LEVOTHROID) 50 MCG tablet Take 50 mcg by mouth daily.     Yes [provider]  lisinopril (ZESTRIL) 5 MG tablet Take 5 mg by mouth daily.   Yes [provider]  LORazepam (ATIVAN) 1 MG tablet Take 0.5 mg by mouth at bedtime as needed for anxiety. For sleep   Yes [provider]  metoprolol succinate (TOPROL-XL) 50 MG 24 hr tablet Take 50 mg by mouth at bedtime. 03/01/19  Yes [provider]  Multiple Vitamins-Minerals (PRESERVISION AREDS 2 PO) Take 1 capsule by mouth daily.   Yes [provider]  betamethasone dipropionate 0.05 % cream Apply topically 2 (two) times daily as needed (Rash). Patient not taking: No sig reported 04/20/19   Clark-Burning, Victorino Dike, PA-C  triamcinolone cream (KENALOG) 0.1 % Apply 1 application topically 2 (two) times daily as needed. Apply to  affected area twice a day as directed. Patient not taking: No sig reported 03/20/19   Derenda Mis, [place]    Physical Exam: Vitals:   11/15/20 1354 11/15/20 1521 11/15/20 1826 11/15/20 1900  BP: 140/65 119/64 (!) 155/91 (!) 149/69  Pulse: 67 75 80 73  Resp: 15 16 18 20   Temp:   98.2 F (36.8 C) 98.1 F (36.7 C)  TempSrc:   Oral Oral  SpO2: 100% 98% 98% 97%  Weight:      Height:        Physical Exam Constitutional:      General: She is not in acute  distress. HENT:     Head: Normocephalic and atraumatic.  Eyes:     Extraocular Movements: Extraocular movements intact.     Conjunctiva/sclera: Conjunctivae normal.  Cardiovascular:     Rate and Rhythm: Normal rate and regular rhythm.     Pulses: Normal pulses.  Pulmonary:     Effort: Pulmonary effort is normal. No respiratory distress.     Breath sounds: Normal breath sounds. No wheezing or rales.  Abdominal:     General: Bowel sounds are normal. There is no distension.     Palpations: Abdomen is soft.     Tenderness: There is no abdominal tenderness.  Musculoskeletal:        General: No swelling or tenderness.     Cervical back: Normal range of motion and neck supple.  Skin:    General: Skin is warm and dry.  Neurological:     General: No focal deficit present.     Mental Status: She is alert and oriented to person, place, and time.     Labs on Admission: I have personally reviewed following labs and imaging studies  CBC: Recent Labs  Lab 11/15/20 1008  WBC 4.5  NEUTROABS 3.1  HGB 11.5*  HCT 35.3*  MCV 93.4  PLT 275   Basic Metabolic Panel: Recent Labs  Lab 11/15/20 1008  NA 137  K 4.6  CL 104  CO2 25  GLUCOSE 92  BUN 20  CREATININE 0.84  CALCIUM 8.9   GFR: Estimated Creatinine Clearance: 37 mL/min (by C-G formula based on SCr of 0.84 mg/dL). Liver Function Tests: No results for input(s): AST, ALT, ALKPHOS, BILITOT, PROT, ALBUMIN in the last 168 hours. No results for input(s): LIPASE, AMYLASE in the last 168 hours. No results for input(s): AMMONIA in the last 168 hours. Coagulation Profile: No results for input(s): INR, PROTIME in the last 168 hours. Cardiac Enzymes: No results for input(s): CKTOTAL, CKMB, CKMBINDEX, TROPONINI in the last 168 hours. BNP (last 3 results) No results for input(s): PROBNP in the last 8760 hours. HbA1C: No results for input(s): HGBA1C in the last 72 hours. CBG: No results for input(s): GLUCAP in the last 168  hours. Lipid Profile: No results for input(s): CHOL, HDL, LDLCALC, TRIG, CHOLHDL, LDLDIRECT in the last 72 hours. Thyroid Function Tests: No results for input(s): TSH, T4TOTAL, FREET4, T3FREE, THYROIDAB in the last 72 hours. Anemia Panel: No results for input(s): VITAMINB12, FOLATE, FERRITIN, TIBC, IRON, RETICCTPCT in the last 72 hours. Urine analysis: No results found for: COLORURINE, APPEARANCEUR, LABSPEC, PHURINE, GLUCOSEU, HGBUR, BILIRUBINUR, KETONESUR, PROTEINUR, UROBILINOGEN, NITRITE, LEUKOCYTESUR  Radiological Exams on Admission: DG Chest 1 View  Result Date: 11/15/2020 CLINICAL DATA:  Fall, left hip pain.  Reduced mobility. EXAM: CHEST  1 VIEW COMPARISON:  04/29/2020 FINDINGS: Atherosclerotic calcification of the aortic arch. Mild lower thoracic and upper lumbar spondylosis. The lungs appear clear. Heart  size within normal limits. No blunting of the costophrenic angles. IMPRESSION: 1. No acute findings. 2. Atherosclerosis is present, including aortoiliac atherosclerotic disease. Electronically Signed   By: Gaylyn Rong M.D.   On: 11/15/2020 09:27   DG Hip Unilat With Pelvis 2-3 Views Left  Result Date: 11/15/2020 CLINICAL DATA:  Fall last week, left hip pain.  Reduced mobility. EXAM: DG HIP (WITH OR WITHOUT PELVIS) 2-3V LEFT COMPARISON:  Reduced mobility. FINDINGS: Acute mildly displaced subcapital femoral neck fracture on the left. Mild spurring of both acetabula. Atherosclerotic vascular calcifications noted. Bony demineralization is present. IMPRESSION: 1. Acute left subcapital femoral neck fracture. 2. Bilateral acetabular spurring. 3. Aortic Atherosclerosis (ICD10-I70.0). Common iliac artery atherosclerosis. Electronically Signed   By: Gaylyn Rong M.D.   On: 11/15/2020 09:25    EKG: Independently reviewed.  Sinus rhythm, no acute changes.  Assessment/Plan Principal Problem:   Closed left hip fracture, initial encounter (HCC) Active Problems:   CAD (coronary artery  disease)   Normocytic anemia   Essential hypertension   Hypothyroidism   Acute left subcapital femoral neck fracture -Secondary to mechanical fall -Neurovascularly intact -Orthopedics planning for surgery tomorrow -Keep n.p.o. after midnight -Gentle IV fluid hydration -Pain management: Morphine as needed, Norco as needed -Nonweightbearing -Hold home aspirin  CAD status post PCI in 2002 -Not endorsing any anginal symptoms. -EKG without acute changes. -Echo done in September 2021 (Care Everywhere) showing LVEF 65 to 70% with prolonged relaxation, no wall motion abnormalities, mild to moderate aortic stenosis, trace mitral regurgitation, and trace tricuspid regurgitation. -Patient appears euvolemic.  Chest x-ray negative for acute finding. -Hold home aspirin -Continue metoprolol and Lipitor  Normocytic anemia -Hemoglobin currently 11.5, MCV 93.4.  Hemoglobin was 12.5 on labs done in August 2022 and previously 11.9 in April 2022. -No signs of active bleeding -Not endorsing any symptoms of GI bleed -Anemia panel -FOBT  Hypertension -Stable -Continue metoprolol and lisinopril  Hyperlipidemia -LDL 53 on 08/01/2020 -Continue Lipitor  Hypothyroidism -TSH normal on labs done 08/01/2020 -Continue Synthroid  Osteoporosis -Per note from PCP (Care Everywhere), history of intolerance to alendronate and Prolia and patient did not want to consider Reclast due to side effects. -Continue vitamin D supplement  DVT prophylaxis: SCDs Code Status: Patient wishes to be DNR. Family Communication: No family available at this time. Disposition Plan: Status is: Inpatient  Remains inpatient appropriate because: Needs surgery for hip fracture.  Level of care:  Level of care: Med-Surg  The medical decision making on this patient was of high complexity and the patient is at high risk for clinical deterioration, therefore this is a level 3 visit.  John Giovanni MD [facility]  If  7PM-7AM, please contact night-coverage [email or web address]  11/15/2020, 10:19 PM

## 2020-11-15 NOTE — ED Triage Notes (Signed)
Larey Seat last Thursday when  the dogs got caught in leashes  and landed on left hip  has been self medicating but still hurts

## 2020-11-15 NOTE — ED Notes (Signed)
Pt given snacks , states wants to go home , husband in room awaits ready room

## 2020-11-15 NOTE — ED Notes (Signed)
[facility] here to get pt report called to hailey at [facility], husband made aware of pt leaving [facility]

## 2020-11-15 NOTE — Progress Notes (Signed)
Patient discussed with ED provider at [facility]  Plan to transfer to [facility], admit to Hospitalist service for preoperative clearance, optimization, and risk stratification.  Plan for OR on 11/16/2020 for screw fixation of left valgus impacted femoral neck fracture.  Will see and discuss plan with patient on 11/16/2020 with full consult note to follow.  Ernestina Columbia M.D. Orthopaedic Surgery [facility]

## 2020-11-15 NOTE — ED Notes (Signed)
Assisted up to br

## 2020-11-15 NOTE — ED Notes (Signed)
Pt given chx dinner, husband in room

## 2020-11-15 NOTE — Progress Notes (Signed)
Received a phone call from Facility: [facility]  Requesting MD: Carmie End Patient with h/o HTN and hypothyroidism presenting with L hip pain following a mechanical fall last Thursday.  Imaging shows acute left subcapital femoral neck fracture.  Dr. Sherilyn Dacosta is on for orthopedics.  Possible surgery today, will keep NPO for now. Plan of care: Transfer to [facility] for surgery, possibly as early as today. The patient will be accepted for admission to Med Surg at [facility] when bed is available.    Nursing staff, Please call the [facility] Admits & Consults System-Wide number at the top of Amion at the time of the patient's arrival so that the patient can be paged to the admitting physician.   Georgana Curio, M.D. [facility]

## 2020-11-16 ENCOUNTER — Encounter (HOSPITAL_COMMUNITY)
Admission: EM | Disposition: A | Discharge status: Home / Self Care | Payer: Self-pay | Source: Home / Self Care | Attending: Internal Medicine

## 2020-11-16 ENCOUNTER — Encounter (HOSPITAL_COMMUNITY): Payer: Self-pay | Admitting: Internal Medicine

## 2020-11-16 ENCOUNTER — Inpatient Hospital Stay (HOSPITAL_COMMUNITY): Payer: Medicare Other

## 2020-11-16 DIAGNOSIS — S72002A Fracture of unspecified part of neck of left femur, initial encounter for closed fracture: Secondary | ICD-10-CM

## 2020-11-16 DIAGNOSIS — I10 Essential (primary) hypertension: Secondary | ICD-10-CM

## 2020-11-16 HISTORY — PX: HIP PINNING,CANNULATED: SHX1758

## 2020-11-16 LAB — IRON AND TIBC
Iron: 35 ug/dL (ref 28–170)
Saturation Ratios: 11 % (ref 10.4–31.8)
TIBC: 319 ug/dL (ref 250–450)
UIBC: 284 ug/dL

## 2020-11-16 LAB — RETICULOCYTES
Immature Retic Fract: 10.9 % (ref 2.3–15.9)
RBC.: 3.62 MIL/uL — ABNORMAL LOW (ref 3.87–5.11)
Retic Count, Absolute: 75.3 10*3/uL (ref 19.0–186.0)
Retic Ct Pct: 2.1 % (ref 0.4–3.1)

## 2020-11-16 LAB — VITAMIN B12: Vitamin B-12: 191 pg/mL (ref 180–914)

## 2020-11-16 LAB — FOLATE: Folate: 14.2 ng/mL (ref >5.9)

## 2020-11-16 LAB — FERRITIN: Ferritin: 13 ng/mL (ref 11–307)

## 2020-11-16 SURGERY — FIXATION, FEMUR, NECK, PERCUTANEOUS, USING SCREW
Anesthesia: Spinal | Site: Hip | Laterality: Left

## 2020-11-16 MED ORDER — DOCUSATE SODIUM 100 MG PO CAPS
100.0000 mg | ORAL_CAPSULE | Freq: Two times a day (BID) | ORAL | Status: DC
  Administered 2020-11-16 – 2020-11-18 (×4): 100 mg via ORAL
  Filled 2020-11-16 (×4): qty 1

## 2020-11-16 MED ORDER — HYDROCODONE-ACETAMINOPHEN 5-325 MG PO TABS
1.0000 | ORAL_TABLET | ORAL | Status: DC | PRN
  Administered 2020-11-17: 10:00:00 1 via ORAL
  Administered 2020-11-17: 19:00:00 2 via ORAL
  Administered 2020-11-18: 1 via ORAL
  Filled 2020-11-16: qty 2

## 2020-11-16 MED ORDER — HYDROCODONE-ACETAMINOPHEN 7.5-325 MG PO TABS
1.0000 | ORAL_TABLET | ORAL | Status: DC | PRN
  Administered 2020-11-17: 23:00:00 1 via ORAL
  Administered 2020-11-17: 04:00:00 2 via ORAL
  Filled 2020-11-16: qty 1
  Filled 2020-11-16: qty 2

## 2020-11-16 MED ORDER — ONDANSETRON HCL 4 MG/2ML IJ SOLN: 4.0000 mg | Freq: Once | INTRAMUSCULAR | Status: DC | PRN

## 2020-11-16 MED ORDER — LACTATED RINGERS IV SOLN: INTRAVENOUS | Status: DC

## 2020-11-16 MED ORDER — LACTATED RINGERS IV SOLN: INTRAVENOUS | Status: DC | PRN

## 2020-11-16 MED ORDER — ACETAMINOPHEN 10 MG/ML IV SOLN: 1000.0000 mg | Freq: Once | INTRAVENOUS | Status: DC | PRN

## 2020-11-16 MED ORDER — BUPIVACAINE HCL (PF) 0.5 % IJ SOLN
INTRAMUSCULAR | Status: DC | PRN
  Administered 2020-11-16: 10 mg via INTRATHECAL

## 2020-11-16 MED ORDER — DEXAMETHASONE SODIUM PHOSPHATE 4 MG/ML IJ SOLN
INTRAMUSCULAR | Status: DC | PRN
  Administered 2020-11-16: 8 mg via INTRAVENOUS

## 2020-11-16 MED ORDER — PHENYLEPHRINE 40 MCG/ML (10ML) SYRINGE FOR IV PUSH (FOR BLOOD PRESSURE SUPPORT)
PREFILLED_SYRINGE | INTRAVENOUS | Status: AC
  Filled 2020-11-16: qty 10

## 2020-11-16 MED ORDER — MORPHINE SULFATE (PF) 4 MG/ML IV SOLN: 0.5000 mg | INTRAVENOUS | Status: DC | PRN

## 2020-11-16 MED ORDER — ADULT MULTIVITAMIN W/MINERALS CH
1.0000 | ORAL_TABLET | Freq: Every day | ORAL | Status: DC
  Filled 2020-11-16 (×2): qty 1

## 2020-11-16 MED ORDER — CHLORHEXIDINE GLUCONATE 0.12 % MT SOLN
15.0000 mL | Freq: Once | OROMUCOSAL | Status: AC
  Administered 2020-11-16: 15 mL via OROMUCOSAL

## 2020-11-16 MED ORDER — PHENYLEPHRINE HCL-NACL 20-0.9 MG/250ML-% IV SOLN
INTRAVENOUS | Status: DC | PRN
  Administered 2020-11-16: 25 ug/min via INTRAVENOUS

## 2020-11-16 MED ORDER — POVIDONE-IODINE 10 % EX SWAB
2.0000 "application " | Freq: Once | CUTANEOUS | Status: AC
  Administered 2020-11-16: 2 via TOPICAL

## 2020-11-16 MED ORDER — FENTANYL CITRATE (PF) 100 MCG/2ML IJ SOLN
INTRAMUSCULAR | Status: DC | PRN
  Administered 2020-11-16: 25 ug via INTRAVENOUS

## 2020-11-16 MED ORDER — ONDANSETRON HCL 4 MG/2ML IJ SOLN
INTRAMUSCULAR | Status: AC
  Filled 2020-11-16: qty 2

## 2020-11-16 MED ORDER — ACETAMINOPHEN 500 MG PO TABS
500.0000 mg | ORAL_TABLET | Freq: Four times a day (QID) | ORAL | Status: AC
  Administered 2020-11-16 – 2020-11-17 (×3): 500 mg via ORAL
  Filled 2020-11-16 (×3): qty 1

## 2020-11-16 MED ORDER — PHENOL 1.4 % MT LIQD
1.0000 | OROMUCOSAL | Status: DC | PRN
  Filled 2020-11-16: qty 177

## 2020-11-16 MED ORDER — 0.9 % SODIUM CHLORIDE (POUR BTL) OPTIME
TOPICAL | Status: DC | PRN
  Administered 2020-11-16: 1000 mL

## 2020-11-16 MED ORDER — PROPOFOL 500 MG/50ML IV EMUL
INTRAVENOUS | Status: DC | PRN
  Administered 2020-11-16: 75 ug/kg/min via INTRAVENOUS

## 2020-11-16 MED ORDER — LORAZEPAM 0.5 MG PO TABS
0.5000 mg | ORAL_TABLET | Freq: Once | ORAL | Status: AC | PRN
  Administered 2020-11-16: 0.5 mg via ORAL
  Filled 2020-11-16: qty 1

## 2020-11-16 MED ORDER — SENNOSIDES-DOCUSATE SODIUM 8.6-50 MG PO TABS: 1.0000 | ORAL_TABLET | Freq: Every evening | ORAL | Status: DC | PRN

## 2020-11-16 MED ORDER — PHENYLEPHRINE HCL (PRESSORS) 10 MG/ML IV SOLN
INTRAVENOUS | Status: AC
  Filled 2020-11-16: qty 2

## 2020-11-16 MED ORDER — CEFAZOLIN SODIUM-DEXTROSE 2-4 GM/100ML-% IV SOLN
2.0000 g | INTRAVENOUS | Status: AC
  Administered 2020-11-16: 2 g via INTRAVENOUS
  Filled 2020-11-16: qty 100

## 2020-11-16 MED ORDER — PROPOFOL 10 MG/ML IV BOLUS
INTRAVENOUS | Status: DC | PRN
  Administered 2020-11-16 (×2): 20 mg via INTRAVENOUS

## 2020-11-16 MED ORDER — CHLORHEXIDINE GLUCONATE 4 % EX LIQD
60.0000 mL | Freq: Once | CUTANEOUS | Status: AC
  Administered 2020-11-16: 4 via TOPICAL

## 2020-11-16 MED ORDER — TRANEXAMIC ACID-NACL 1000-0.7 MG/100ML-% IV SOLN
1000.0000 mg | Freq: Once | INTRAVENOUS | Status: AC
  Administered 2020-11-16: 1000 mg via INTRAVENOUS
  Filled 2020-11-16: qty 100

## 2020-11-16 MED ORDER — TRANEXAMIC ACID-NACL 1000-0.7 MG/100ML-% IV SOLN
1000.0000 mg | INTRAVENOUS | Status: AC
  Administered 2020-11-16: 1000 mg via INTRAVENOUS
  Filled 2020-11-16: qty 100

## 2020-11-16 MED ORDER — ISOPROPYL ALCOHOL 70 % SOLN
Status: AC
  Filled 2020-11-16: qty 480

## 2020-11-16 MED ORDER — PHENYLEPHRINE 40 MCG/ML (10ML) SYRINGE FOR IV PUSH (FOR BLOOD PRESSURE SUPPORT)
PREFILLED_SYRINGE | INTRAVENOUS | Status: DC | PRN
  Administered 2020-11-16 (×2): 120 ug via INTRAVENOUS

## 2020-11-16 MED ORDER — FENTANYL CITRATE PF 50 MCG/ML IJ SOSY: 25.0000 ug | PREFILLED_SYRINGE | INTRAMUSCULAR | Status: DC | PRN

## 2020-11-16 MED ORDER — ENOXAPARIN SODIUM 40 MG/0.4ML IJ SOSY
40.0000 mg | PREFILLED_SYRINGE | INTRAMUSCULAR | Status: DC
  Administered 2020-11-17: 08:00:00 40 mg via SUBCUTANEOUS
  Filled 2020-11-16 (×2): qty 0.4

## 2020-11-16 MED ORDER — ONDANSETRON HCL 4 MG/2ML IJ SOLN
INTRAMUSCULAR | Status: DC | PRN
  Administered 2020-11-16: 4 mg via INTRAVENOUS

## 2020-11-16 MED ORDER — ONDANSETRON HCL 4 MG PO TABS: 4.0000 mg | ORAL_TABLET | Freq: Four times a day (QID) | ORAL | Status: DC | PRN

## 2020-11-16 MED ORDER — BISACODYL 10 MG RE SUPP
10.0000 mg | Freq: Once | RECTAL | Status: AC
  Administered 2020-11-16: 10 mg via RECTAL
  Filled 2020-11-16: qty 1

## 2020-11-16 MED ORDER — CEFAZOLIN SODIUM-DEXTROSE 2-4 GM/100ML-% IV SOLN
2.0000 g | Freq: Four times a day (QID) | INTRAVENOUS | Status: AC
  Administered 2020-11-16 – 2020-11-17 (×2): 2 g via INTRAVENOUS
  Filled 2020-11-16 (×3): qty 100

## 2020-11-16 MED ORDER — ONDANSETRON HCL 4 MG/2ML IJ SOLN: 4.0000 mg | Freq: Four times a day (QID) | INTRAMUSCULAR | Status: DC | PRN

## 2020-11-16 MED ORDER — ACETAMINOPHEN 325 MG PO TABS: 325.0000 mg | ORAL_TABLET | Freq: Four times a day (QID) | ORAL | Status: DC | PRN

## 2020-11-16 MED ORDER — ENSURE SURGERY PO LIQD
237.0000 mL | Freq: Two times a day (BID) | ORAL | Status: DC
  Administered 2020-11-17: 10:00:00 237 mL via ORAL
  Filled 2020-11-16 (×5): qty 237

## 2020-11-16 MED ORDER — DEXAMETHASONE SODIUM PHOSPHATE 10 MG/ML IJ SOLN
INTRAMUSCULAR | Status: AC
  Filled 2020-11-16: qty 1

## 2020-11-16 MED ORDER — MENTHOL 3 MG MT LOZG: 1.0000 | LOZENGE | OROMUCOSAL | Status: DC | PRN

## 2020-11-16 SURGICAL SUPPLY — 35 items
BAG COUNTER SPONGE SURGICOUNT (BAG) IMPLANT
BAG ZIPLOCK 12X15 (MISCELLANEOUS) ×2 IMPLANT
BIT DRILL CANN LRG QC 5X300 (BIT) ×2 IMPLANT
BNDG GAUZE ELAST 4 BULKY (GAUZE/BANDAGES/DRESSINGS) ×2 IMPLANT
DERMABOND ADVANCED (GAUZE/BANDAGES/DRESSINGS) ×1
DERMABOND ADVANCED .7 DNX12 (GAUZE/BANDAGES/DRESSINGS) ×1 IMPLANT
DRAPE STERI IOBAN 125X83 (DRAPES) ×2 IMPLANT
DRESSING MEPILEX FLEX 4X4 (GAUZE/BANDAGES/DRESSINGS) ×1 IMPLANT
DRSG EMULSION OIL 3X16 NADH (GAUZE/BANDAGES/DRESSINGS) ×2 IMPLANT
DRSG MEPILEX FLEX 4X4 (GAUZE/BANDAGES/DRESSINGS) ×2
DRSG PAD ABDOMINAL 8X10 ST (GAUZE/BANDAGES/DRESSINGS) ×2 IMPLANT
DURAPREP 26ML APPLICATOR (WOUND CARE) ×2 IMPLANT
ELECT REM PT RETURN 15FT ADLT (MISCELLANEOUS) ×2 IMPLANT
GAUZE SPONGE 4X4 12PLY STRL (GAUZE/BANDAGES/DRESSINGS) ×2 IMPLANT
GLOVE SURG ENC MOIS LTX SZ8 (GLOVE) ×2 IMPLANT
GLOVE SURG NEOP MICRO LF SZ7.5 (GLOVE) ×2 IMPLANT
GUIDEWIRE THREADED 2.8 (WIRE) ×6 IMPLANT
KIT TURNOVER KIT A (KITS) IMPLANT
MANIFOLD NEPTUNE II (INSTRUMENTS) ×2 IMPLANT
NS IRRIG 1000ML POUR BTL (IV SOLUTION) ×2 IMPLANT
PACK GENERAL/GYN (CUSTOM PROCEDURE TRAY) ×2 IMPLANT
PAD CAST 4YDX4 CTTN HI CHSV (CAST SUPPLIES) ×1 IMPLANT
PADDING CAST COTTON 4X4 STRL (CAST SUPPLIES) ×1
PROTECTOR NERVE ULNAR (MISCELLANEOUS) ×2 IMPLANT
SCREW CANN 32MM 6.5X70MM (Screw) ×2 IMPLANT
SCREW CANN 6.5X70MM (Screw) ×4 IMPLANT
SPONGE T-LAP 18X18 ~~LOC~~+RFID (SPONGE) ×2 IMPLANT
STRIP CLOSURE SKIN 1/2X4 (GAUZE/BANDAGES/DRESSINGS) ×2 IMPLANT
SUT MNCRL AB 3-0 PS2 18 (SUTURE) ×2 IMPLANT
SUT VIC AB 1 CT1 36 (SUTURE) ×4 IMPLANT
SUT VIC AB 2-0 CT1 27 (SUTURE) ×2
SUT VIC AB 2-0 CT1 TAPERPNT 27 (SUTURE) ×2 IMPLANT
TRAY FOLEY MTR SLVR 16FR STAT (SET/KITS/TRAYS/PACK) IMPLANT
WASHER FOR 5.0 SCREWS (Washer) ×2 IMPLANT
WATER STERILE IRR 1000ML POUR (IV SOLUTION) ×2 IMPLANT

## 2020-11-16 NOTE — Progress Notes (Signed)
Initial Nutrition Assessment  DOCUMENTATION CODES:   Not applicable  INTERVENTION:  - diet advancement as medically feasible post-op. - will order Ensure Surgery BID, each supplement provides 330 kcal and 18 grams of protein. - will order 1 tablet multivitamin with minerals/day. - complete NFPE when feasible.    NUTRITION DIAGNOSIS:   Increased nutrient needs related to hip fracture, post-op healing as evidenced by estimated needs.  GOAL:   Patient will meet greater than or equal to 90% of their needs  MONITOR:   Diet advancement, PO intake, Supplement acceptance, Labs, Weight trends, Skin  REASON FOR ASSESSMENT:   Consult Hip fracture protocol  ASSESSMENT:   80 y.o. female with medical history of HTN, HLD, hypothyroidism, CAD s/p PCI in 2002, and osteoporosis. She presented to the ED due to L hip pain after a mechanical fall last week. In the ED, x-ray of L hip/pelvis showed acute L subcapital femoral neck fx. CXR was negative for acute findings. Orthopedics consulted and plan for surgery on 11/16/20.  Patient has been NPO since admission. Pending surgical fixation today of L hip fracture.  She has not been seen by a [place] RD at any time in the past.   Weight yesterday was 107 lb and PTA the most recently documented weight was 112 lb on 04/29/20. This indicates 5 lb weight loss (4.5% body weight).    Labs reviewed. Medications reviewed; 1000 units cholecalciferol/day, 50 mcg oral synthroid/day.    NUTRITION - FOCUSED PHYSICAL EXAM:  Unable to complete at this time.   Diet Order:   Diet Order             Diet NPO time specified  Diet effective now                   EDUCATION NEEDS:   No education needs have been identified at this time  Skin:  Skin Assessment: Reviewed RN Assessment  Last BM:  PTA/unknown  Height:   Ht Readings from Last 1 Encounters:  11/15/20 4\' 11"  (1.499 m)    Weight:   Wt Readings from Last 1 Encounters:   11/15/20 48.5 kg     Estimated Nutritional Needs:  Kcal:  1500-1750 kcal Protein:  75-85 grams Fluid:  >/= 1.6 L/day      11/17/20, MS, RD, LDN, CNSC Inpatient Clinical Dietitian RD pager # available in AMION  After hours/weekend pager # available in [facility]

## 2020-11-16 NOTE — Progress Notes (Signed)
PROGRESS NOTE    Sandy Summers  JOI:[id number] DOB: [birth year] DOA: 11/15/2020 PCP: Pcp, No    Brief Narrative:  80 y.o. female with medical history significant of hypertension, hyperlipidemia, hypothyroidism, CAD status post PCI in 2002, osteoporosis presented to the ED complaining of left hip pain after a mechanical fall last week.  Vital signs stable.  Labs showing WBC 4.5, hemoglobin 11.5, platelet count 275k.  Sodium 137, potassium 4.6, chloride 104, bicarb 25, BUN 20, creatinine 0.8, glucose 92.  Screening COVID and influenza PCR negative.  X-ray of left hip/pelvis showing acute left subcapital femoral neck fracture after tripping over walking dogs. Pt planned for surgery 11/16/20  Assessment & Plan:   Principal Problem:   Closed left hip fracture, initial encounter [facility]) Active Problems:   CAD (coronary artery disease)   Normocytic anemia   Essential hypertension   Hypothyroidism  Acute left subcapital femoral neck fracture -Secondary to mechanical fall -Neurovascularly intact -Orthopedics following, for surgery today -Anticipate PT/OT eval post-op   CAD status post PCI in 2002 -Not endorsing any anginal symptoms. -EKG without acute changes. -Echo done in September 2021 ([facility] Every[facility]) showing LVEF 65 to 70% with prolonged relaxation, no wall motion abnormalities, mild to moderate aortic stenosis, trace mitral regurgitation, and trace tricuspid regurgitation. -Patient appears euvolemic at this time.  Chest x-ray negative for acute finding.   Normocytic anemia -Hemoglobin currently 11.5, MCV 93.4.  Hemoglobin was 12.5 on labs done in August 2022 and previously 11.9 in April 2022. -No signs of active bleeding -Not endorsing any symptoms of GI bleed -Iron levels within normal limits -Recheck cbc in AM   Hypertension -Stable -Continue metoprolol and lisinopril   Hyperlipidemia -LDL 53 on 08/01/2020 -Continue Lipitor   Hypothyroidism -TSH normal on labs done  08/01/2020 -Continue Synthroid   Osteoporosis -Per note from PCP ([facility] Every[facility]), history of intolerance to alendronate and Prolia and patient did not want to consider Reclast due to side effects. -Continue vitamin D supplement  Constipation -This AM, reports difficulty moving bowels, last bm was day before admit with small hard stool noted -Will give trial of dulcolax suppository   DVT prophylaxis: SCD's Code Status: DNR Family Communication: Pt in room, family at bedside  Status is: Inpatient  Remains inpatient appropriate because: Severity of illness requiring surgery   Consultants:  Orthopedic Surgery  Procedures:    Antimicrobials: Anti-infectives (From admission, onward)    Start     Dose/Rate Route Frequency Ordered Stop   11/16/20 1400  ceFAZolin (ANCEF) IVPB 2g/100 mL premix        2 g 200 mL/hr over 30 Minutes Intravenous On call to O.R. 11/16/20 1131 11/17/20 0559       Subjective: Feeling constipated this AM  Objective: Vitals:   11/16/20 0326 11/16/20 0700 11/16/20 1339 11/16/20 1358  BP: (!) 141/79 (!) 145/75 (!) 154/68   Pulse: 68 68 74   Resp: 18 18 15    Temp: 98 F (36.7 C) 98 F (36.7 C) 98.2 F (36.8 C)   TempSrc: Oral Oral Oral   SpO2: 99% 98% 96%   Weight:    48.5 kg  Height:    4\' 11"  (1.499 m)    Intake/Output Summary (Last 24 hours) at 11/16/2020 1459 Last data filed at 11/16/2020 0356 Gross per 24 hour  Intake --  Output 350 ml  Net -350 ml   Filed Weights   11/15/20 0848 11/16/20 1358  Weight: 48.5 kg 48.5 kg    Examination: General exam:  Awake, laying in bed, in nad Respiratory system: Normal respiratory effort, no wheezing Cardiovascular system: regular rate, s1, s2 Gastrointestinal system: Soft, nondistended, positive BS Central nervous system: CN2-12 grossly intact, strength intact Extremities: Perfused, no clubbing Skin: Normal skin turgor, no notable skin lesions seen Psychiatry: Mood normal // no visual  hallucinations   Data Reviewed: I have personally reviewed following labs and imaging studies  CBC: Recent Labs  Lab 11/15/20 1008  WBC 4.5  NEUTROABS 3.1  HGB 11.5*  HCT 35.3*  MCV 93.4  PLT 275   Basic Metabolic Panel: Recent Labs  Lab 11/15/20 1008  NA 137  K 4.6  CL 104  CO2 25  GLUCOSE 92  BUN 20  CREATININE 0.84  CALCIUM 8.9   GFR: Estimated Creatinine Clearance: 37 mL/min (by C-G formula based on SCr of 0.84 mg/dL). Liver Function Tests: No results for input(s): AST, ALT, ALKPHOS, BILITOT, PROT, ALBUMIN in the last 168 hours. No results for input(s): LIPASE, AMYLASE in the last 168 hours. No results for input(s): AMMONIA in the last 168 hours. Coagulation Profile: No results for input(s): INR, PROTIME in the last 168 hours. Cardiac Enzymes: No results for input(s): CKTOTAL, CKMB, CKMBINDEX, TROPONINI in the last 168 hours. BNP (last 3 results) No results for input(s): PROBNP in the last 8760 hours. HbA1C: No results for input(s): HGBA1C in the last 72 hours. CBG: No results for input(s): GLUCAP in the last 168 hours. Lipid Profile: No results for input(s): CHOL, HDL, LDLCALC, TRIG, CHOLHDL, LDLDIRECT in the last 72 hours. Thyroid Function Tests: No results for input(s): TSH, T4TOTAL, FREET4, T3FREE, THYROIDAB in the last 72 hours. Anemia Panel: Recent Labs    11/16/20 0505  VITAMINB12 191  FOLATE 14.2  FERRITIN 13  TIBC 319  IRON 35  RETICCTPCT 2.1   Sepsis Labs: No results for input(s): PROCALCITON, LATICACIDVEN in the last 168 hours.  Recent Results (from the past 240 hour(s))  Resp Panel by RT-PCR (Flu A&B, Covid) Nasopharyngeal Swab     Status: None   Collection Time: 11/15/20 10:08 AM   Specimen: Nasopharyngeal Swab; Nasopharyngeal(NP) swabs in vial transport medium  Result Value Ref Range Status   SARS Coronavirus 2 by RT PCR NEGATIVE NEGATIVE Final    Comment: (NOTE) SARS-CoV-2 target nucleic acids are NOT DETECTED.  The SARS-CoV-2  RNA is generally detectable in upper respiratory specimens during the acute phase of infection. The lowest concentration of SARS-CoV-2 viral copies this assay can detect is 138 copies/mL. A negative result does not preclude SARS-Cov-2 infection and should not be used as the sole basis for treatment or other patient management decisions. A negative result may occur with  improper specimen collection/handling, submission of specimen other than nasopharyngeal swab, presence of viral mutation(s) within the areas targeted by this assay, and inadequate number of viral copies(<138 copies/mL). A negative result must be combined with clinical observations, patient history, and epidemiological information. The expected result is Negative.  Fact Sheet for Patients:  BloggerCourse.com  Fact Sheet for Healthcare Providers:  SeriousBroker.it  This test is no t yet approved or cleared by the [place] FDA and  has been authorized for detection and/or diagnosis of SARS-CoV-2 by FDA under an Emergency Use Authorization (EUA). This EUA will remain  in effect (meaning this test can be used) for the duration of the COVID-19 declaration under Section 564(b)(1) of the Act, 21 U.S.C.section 360bbb-3(b)(1), unless the authorization is terminated  or revoked sooner.       Influenza A  by PCR NEGATIVE NEGATIVE Final   Influenza B by PCR NEGATIVE NEGATIVE Final    Comment: (NOTE) The Xpert Xpress SARS-CoV-2/FLU/RSV plus assay is intended as an aid in the diagnosis of influenza from Nasopharyngeal swab specimens and should not be used as a sole basis for treatment. Nasal washings and aspirates are unacceptable for Xpert Xpress SARS-CoV-2/FLU/RSV testing.  Fact Sheet for Patients: BloggerCourse.com  Fact Sheet for Healthcare Providers: SeriousBroker.it  This test is not yet approved or cleared by the  [place] FDA and has been authorized for detection and/or diagnosis of SARS-CoV-2 by FDA under an Emergency Use Authorization (EUA). This EUA will remain in effect (meaning this test can be used) for the duration of the COVID-19 declaration under Section 564(b)(1) of the Act, 21 U.S.C. section 360bbb-3(b)(1), unless the authorization is terminated or revoked.  Performed at [facility], [address] [place], [place] [address]      Radiology Studies: DG Chest 1 View  Result Date: 11/15/2020 CLINICAL DATA:  Fall, left hip pain.  Reduced mobility. EXAM: CHEST  1 VIEW COMPARISON:  04/29/2020 FINDINGS: Atherosclerotic calcification of the aortic arch. Mild lower thoracic and upper lumbar spondylosis. The lungs appear clear. Heart size within normal limits. No blunting of the costophrenic angles. IMPRESSION: 1. No acute findings. 2. Atherosclerosis is present, including aortoiliac atherosclerotic disease. Electronically Signed   By: Gaylyn Rong M.D.   On: 11/15/2020 09:27   DG Hip Unilat With Pelvis 2-3 Views Left  Result Date: 11/15/2020 CLINICAL DATA:  Fall last week, left hip pain.  Reduced mobility. EXAM: DG HIP (WITH OR WITHOUT PELVIS) 2-3V LEFT COMPARISON:  Reduced mobility. FINDINGS: Acute mildly displaced subcapital femoral neck fracture on the left. Mild spurring of both acetabula. Atherosclerotic vascular calcifications noted. Bony demineralization is present. IMPRESSION: 1. Acute left subcapital femoral neck fracture. 2. Bilateral acetabular spurring. 3. Aortic Atherosclerosis (ICD10-I70.0). Common iliac artery atherosclerosis. Electronically Signed   By: Gaylyn Rong M.D.   On: 11/15/2020 09:25    Scheduled Meds:  [MAR Hold] atorvastatin  20 mg Oral Daily   [MAR Hold] cholecalciferol  1,000 Units Oral Daily   [MAR Hold] feeding supplement  237 mL Oral BID BM   [MAR Hold] levothyroxine  50 mcg Oral Q0600   [MAR Hold] lisinopril  5 mg Oral Daily    [MAR Hold] metoprolol succinate  50 mg Oral QHS   [MAR Hold] multivitamin with minerals  1 tablet Oral Daily   Continuous Infusions:   ceFAZolin (ANCEF) IV     lactated ringers 10 mL/hr at 11/16/20 1351   tranexamic acid       LOS: 1 day   Rickey Barbara, MD [facility] Pager On Amion  If 7PM-7AM, please contact night-coverage 11/16/2020, 2:59 PM

## 2020-11-16 NOTE — Transfer of Care (Signed)
Immediate Anesthesia Transfer of Care Note  Patient: Sandy Summers  Procedure(s) Performed: CANNULATED HIP PINNING (Left: Hip)  Patient Location: PACU  Anesthesia Type:Spinal  Level of Consciousness: sedated, patient cooperative and responds to stimulation  Airway & Oxygen Therapy: Patient Spontanous Breathing and Patient connected to face mask oxygen  Post-op Assessment: Report given to RN and Post -op Vital signs reviewed and stable  Post vital signs: Reviewed and stable  Last Vitals:  Vitals Value Taken Time  BP 121/68 11/16/20 1745  Temp    Pulse 70 11/16/20 1745  Resp 19 11/16/20 1745  SpO2 100 % 11/16/20 1745  Vitals shown include unvalidated device data.  Last Pain:  Vitals:   11/16/20 1358  TempSrc:   PainSc: 0-No pain         Complications: No notable events documented.

## 2020-11-16 NOTE — Consult Note (Signed)
Orthopaedic Consult  Date/Time: 11/16/20 5:48 AM  Patient Name: Sandy Summers  Attending Physician: Jonah Blue, MD   Time first seen by orthopaedics: 0535 on 11/16/20   ASSESSMENT & PLAN  Orthopaedic Assessment: 80 y.o. female with left valgus impacted subcapital femoral neck fracture  Reductions/Procedures/Splinting/Anesthesia Performed: Reductions: None Splinting/casting: None Procedure(s): None Anesthesia: N/A  Plan: Patient wishes to proceed with cannulated screw fixation of left valgus impacted subcapital femoral neck fracture.  Plan for OR this afternoon, soonest available time.  Discussed potential nonoperative treatment with patient, explained that this may be an option but that she would be at risk of subsequent displacement and at that point may need surgical treatment the form of a total hip arthroplasty.  Following discussion of these options, she stated that she did wish to move forward with screw fixation.  Risks of the proposed surgical treatment were discussed with the patient, including bleeding, wound healing complications, infection, damage to surrounding structures, persistent pain, stiffness, lack of improvement, potential for subsequent arthritis or worsening of pre-existing arthritis, nonunion, malunion, and need for further surgery, as well as complications related to anesthesia, cardiovascular complications, and death.  All questions were answered to the patient's satisfaction.  She understands all of this and wishes to proceed with surgery.   Ernestina Columbia M.D. Orthopaedic Surgery [facility]   Medical Decision Making  Amount/complexity of data: Is there a pathologic fracture (e.g. neoplastic, osteoporotic insufficiency fracture)? Yes Independent interpretation of radiographic studies: Yes Review of radiology results (e.g. reports): Yes Tests ordered (e.g. additional radiographic studies, labs): No Lab results reviewed:  Yes Reviewed old records: Yes History from another source (independent historian, e.g. family/friend/etc.): No Risk: Patient receiving IV controlled substances for pain: No Fracture requiring manipulation: No Urgent or emergent (non-elective) surgery likely this admission: Yes Presence of medical comorbidities and/or surgical risk factors (e.g. current smoker, CAD, diabetes, COPD, CKD, etc.): Yes Closed fracture management WITHOUT manipulation: No Urgent minor procedure (e.g. joint aspiration, compartment pressure measurement, etc.): No Will likely need surgery as an outpatient: No     HPI Sandy Summers is a 80 y.o. female. Orthopaedic consultation has specifically been requested to address this patient's current musculoskeletal presentation. She sustained a fall and had immediate pain in the left hip/thigh.  Pain is sharp and severe.  Pain is worse movement and better with rest and immobilization.  Since the fall the patient was able to bear weight/ambulate but with severe pain.  At baseline the patient is ambulatory with no assistive devices.     PMH Past Medical History:  Diagnosis Date   Hypertension    Thyroid disease      PSH Past Surgical History:  Procedure Laterality Date   CORONARY ANGIOPLASTY WITH STENT PLACEMENT  in 2002   Home Medications Prior to Admission medications   Medication Sig Start Date End Date Taking? Authorizing Provider  aspirin 81 MG chewable tablet Chew 81 mg by mouth daily.     Yes [provider]  atorvastatin (LIPITOR) 20 MG tablet Take 20 mg by mouth daily.   Yes [provider]  Cholecalciferol 25 MCG (1000 UT) tablet Take 1,000 Units by mouth daily.   Yes [provider]  levothyroxine (SYNTHROID, LEVOTHROID) 50 MCG tablet Take 50 mcg by mouth daily.     Yes [provider]  lisinopril (ZESTRIL) 5 MG tablet Take 5 mg by mouth daily.   Yes [provider]  LORazepam (ATIVAN) 1 MG tablet Take 0.5  mg by  mouth at bedtime as needed for anxiety. For sleep   Yes [provider]  metoprolol succinate (TOPROL-XL) 50 MG 24 hr tablet Take 50 mg by mouth at bedtime. 03/01/19  Yes [provider]  Multiple Vitamins-Minerals (PRESERVISION AREDS 2 PO) Take 1 capsule by mouth daily.   Yes [provider]  betamethasone dipropionate 0.05 % cream Apply topically 2 (two) times daily as needed (Rash). Patient not taking: No sig reported 04/20/19   Clark-Burning, Victorino Dike, PA-C  triamcinolone cream (KENALOG) 0.1 % Apply 1 application topically 2 (two) times daily as needed. Apply to affected area twice a day as directed. Patient not taking: No sig reported 03/20/19   Clark-Burning, Victorino Dike, PA-C     Allergies Allergies  Allergen Reactions   Alendronate Nausea Only   Amoxicillin-Pot Clavulanate Diarrhea   Denosumab Other (See Comments)    I just didn't like it    Gabapentin Other (See Comments)    Ineffective for sleep "made me crazy"     Family History History reviewed. No pertinent family history.  Social History Social History   Socioeconomic History   Marital status: Married    Spouse name: Not on file   Number of children: Not on file   Years of education: Not on file   Highest education level: Not on file  Occupational History   Not on file  Tobacco Use   Smoking status: Former    Types: Cigarettes    Quit date: 10/06/1995    Years since quitting: 25.1   Smokeless tobacco: Never  Substance and Sexual Activity   Alcohol use: No   Drug use: No   Sexual activity: Not Currently  Other Topics Concern   Not on file  Social History Narrative   Not on file   Social Determinants of Health   Financial Resource Strain: Not on file  Food Insecurity: Not on file  Transportation Needs: Not on file  Physical Activity: Not on file  Stress: Not on file  Social Connections: Not on file  Intimate Partner Violence: Not on file     Review of Systems MSK: As noted per  HPI above GI: No current Nausea/vomiting ENT: Denies sore throat, epistaxis CV: Denies chest pain  Resp: No current shortness of breath  Other than mentioned above, there are no Constitutional, Neurological, Psychiatric, ENT, Ophthalmological, Cardiovascular, Respiratory, GI, GU, Musculoskeletal, Integumentary, Lymphatic, Endocrine or Allergic issues.    Imaging  Independent interpretation of orthopaedic-relevant films: L valgus impacted femoral neck fx  Radiographic results: DG Chest 1 View  Result Date: 11/15/2020 CLINICAL DATA:  Fall, left hip pain.  Reduced mobility. EXAM: CHEST  1 VIEW COMPARISON:  04/29/2020 FINDINGS: Atherosclerotic calcification of the aortic arch. Mild lower thoracic and upper lumbar spondylosis. The lungs appear clear. Heart size within normal limits. No blunting of the costophrenic angles. IMPRESSION: 1. No acute findings. 2. Atherosclerosis is present, including aortoiliac atherosclerotic disease. Electronically Signed   By: Gaylyn Rong M.D.   On: 11/15/2020 09:27   DG Hip Unilat With Pelvis 2-3 Views Left  Result Date: 11/15/2020 CLINICAL DATA:  Fall last week, left hip pain.  Reduced mobility. EXAM: DG HIP (WITH OR WITHOUT PELVIS) 2-3V LEFT COMPARISON:  Reduced mobility. FINDINGS: Acute mildly displaced subcapital femoral neck fracture on the left. Mild spurring of both acetabula. Atherosclerotic vascular calcifications noted. Bony demineralization is present. IMPRESSION: 1. Acute left subcapital femoral neck fracture. 2. Bilateral acetabular spurring. 3. Aortic Atherosclerosis (ICD10-I70.0). Common iliac artery atherosclerosis.  Electronically Signed   By: Gaylyn Rong M.D.   On: 11/15/2020 09:25   Labs  Recent Labs    11/15/20 1008  WBC 4.5  HGB 11.5*  HCT 35.3*  PLT 275   Recent Labs    11/15/20 1008  NA 137  K 4.6  CL 104  CO2 25  BUN 20  CREATININE 0.84  GLUCOSE 92  CALCIUM 8.9   No results found for: INR, PROTIME       Physical Examination  Patient is a 80 y.o. year old female who is alert, well appearing, and in no distress, mood is alert.  Orientation: oriented to person, place, time, and general circumstances  Vital Signs: BP (!) 141/79 (BP Location: Left Arm)   Pulse 68   Temp 98 F (36.7 C) (Oral)   Resp 18   Ht 4\' 11"  (1.499 m)   Wt 48.5 kg   SpO2 99%   BMI 21.61 kg/m    Gait: Supine on stretcher  Heart: Normal rate Lungs: Non-labored breathing Abdomen: Soft, Non-tender   Right Upper Extremity: Inspection: Atraumatic Palpation: Nontender ROM: Full, painless Joint Stability: No instability Strength: Normal Skin: Intact Peripheral Vascular: Normal Reflexes: No pathologic Sensation: Intact distally Lymph Nodes: None Palpable Coordination: Normal Left Upper Extremity: Inspection: Atraumatic Palpation: Nontender ROM: Full, painless Joint Stability: No instability Strength: Normal Skin: Intact Peripheral Vascular: Normal Reflexes: No pathologic Sensation: Intact distally Lymph Nodes: None Palpable Coordination: Normal  Right Lower Extremity: Inspection: Atraumatic Palpation: Nontender ROM: Full, painless Joint Stability: No instability Strength: Normal Skin: Intact Peripheral Vascular: Normal Reflexes: No pathologic Sensation: Intact distally Lymph Nodes: None Palpable Coordination: Normal Left Lower Extremity: Inspection: Atraumatic appearance Palpation: Mild TTP L hip ROM: discomfort with log ro Joint Stability: No instability knee and distal Strength: +DF/PF/EHL Skin: Intact, ecchymosis over greater troch Peripheral Vascular: 2+ DP/PT Reflexes: No pathologic Sensation: Intact distally Lymph Nodes: None Palpable Coordination: Unable to assess  Pelvis: Skin: Intact Palpation: Nontender  Stability: No instability      The review of the patient's medications does not in any way constitute an endorsement, by this clinician,  of their use, dosage,  indications, route, efficacy, interactions, or other clinical parameters.  This note was generated within the [employer] EMR using Dragon medical speech recognition software and may contain inherent errors or omissions not intended by the user. Grammatical and punctuation errors, random word insertions, deletions, pronoun errors and incomplete sentences are occasional consequences of this technology due to software limitations. Not all errors are caught or corrected.  Although every attempt is made to root out erroneus and incomplete transcription, the note may still not fully represent the intent or opinion of the author. If there are questions or concerns about the content of this note or information contained within the body of this dictation they should be addressed directly with the author for clarification.

## 2020-11-16 NOTE — Anesthesia Procedure Notes (Signed)
Procedure Name: MAC Date/Time: 11/16/2020 3:50 PM Performed by: Niel Hummer, CRNA Pre-anesthesia Checklist: Patient identified, Emergency Drugs available, Suction available and Patient being monitored Oxygen Delivery Method: Simple face mask

## 2020-11-16 NOTE — Anesthesia Postprocedure Evaluation (Signed)
Anesthesia Post Note  Patient: [name]  Procedure(s) Performed: CANNULATED HIP PINNING (Left: Hip)     Patient location during evaluation: PACU Anesthesia Type: Spinal Level of consciousness: awake and alert Pain management: pain level controlled Vital Signs Assessment: post-procedure vital signs reviewed and stable Respiratory status: spontaneous breathing and respiratory function stable Cardiovascular status: blood pressure returned to baseline and stable Postop Assessment: spinal receding and no apparent nausea or vomiting Anesthetic complications: no   No notable events documented.  Last Vitals:  Vitals:   11/16/20 1900 11/16/20 2012  BP: (!) 158/77 133/66  Pulse: 80 88  Resp: 18 20  Temp: 36.7 C 36.4 C  SpO2: 97% 98%    Last Pain:  Vitals:   11/16/20 2012  TempSrc: Axillary  PainSc: 2                  Beryle Lathe

## 2020-11-16 NOTE — Anesthesia Preprocedure Evaluation (Signed)
Anesthesia Evaluation  Patient identified by MRN, date of birth, ID band Patient awake    Reviewed: Allergy & Precautions, NPO status , Patient's Chart, lab work & pertinent test results  Airway Mallampati: I  TM Distance: >3 FB Neck ROM: Full    Dental no notable dental hx. (+) Dental Advisory Given, Edentulous Upper, Edentulous Lower   Pulmonary former smoker,    Pulmonary exam normal breath sounds clear to auscultation       Cardiovascular hypertension, Pt. on medications + CAD  Normal cardiovascular exam Rhythm:Regular Rate:Normal     Neuro/Psych negative neurological ROS  negative psych ROS   GI/Hepatic Neg liver ROS,   Endo/Other  Hypothyroidism   Renal/GU negative Renal ROSLab Results      Component                Value               Date                      CREATININE               0.84                11/15/2020                BUN                      20                  11/15/2020                NA                       137                 11/15/2020                K                        4.6                 11/15/2020                CL                       104                 11/15/2020                CO2                      25                  11/15/2020                Musculoskeletal negative musculoskeletal ROS (+)   Abdominal   Peds  Hematology Lab Results      Component                Value               Date                      WBC  4.5                 11/15/2020                HGB                      11.5 (L)            11/15/2020                HCT                      35.3 (L)            11/15/2020                MCV                      93.4                11/15/2020                PLT                      275                 11/15/2020              Anesthesia Other Findings   Reproductive/Obstetrics negative OB ROS                             Anesthesia Physical Anesthesia Plan  ASA: 3  Anesthesia Plan: Spinal   Post-op Pain Management:    Induction: Intravenous  PONV Risk Score and Plan: Treatment may vary due to age or medical condition  Airway Management Planned: Nasal Cannula and Natural Airway  Additional Equipment: None  Intra-op Plan:   Post-operative Plan:   Informed Consent:     Dental advisory given  Plan Discussed with:   Anesthesia Plan Comments: (Spinal w L side up )        Anesthesia Quick Evaluation

## 2020-11-16 NOTE — Anesthesia Procedure Notes (Addendum)
Spinal  Patient location during procedure: OR Start time: 11/16/2020 3:55 PM End time: 11/16/2020 4:01 PM Reason for block: surgical anesthesia Staffing Performed: anesthesiologist  Anesthesiologist: Trevor Iha, MD Preanesthetic Checklist Completed: patient identified, IV checked, site marked, risks and benefits discussed, surgical consent, monitors and equipment checked, pre-op evaluation and timeout performed Spinal Block Patient position: right lateral decubitus Prep: DuraPrep Patient monitoring: heart rate, cardiac monitor, continuous pulse ox and blood pressure Approach: midline Location: L2-3 Injection technique: single-shot Needle Needle type: Sprotte  Needle gauge: 24 G Needle length: 9 cm Needle insertion depth: 6 cm Assessment Sensory level: T6 Events: CSF return Additional Notes 1 Attempt. Pt tolerated procedure well.

## 2020-11-16 NOTE — Progress Notes (Signed)
Orthopedic Tech Progress Note Patient Details:  Sandy Summers 10-Apr-1940 [id number]  Patient ID: [name], female   DOB: [birth year], 80 y.o.   MRN: [id number] No OHF; pt is over 70.  [place] A Alantra Pop[place] 11/16/2020, 9:00 AM

## 2020-11-16 NOTE — Op Note (Signed)
OPERATIVE NOTE  Sandy Summers female 80 y.o. 11/16/2020  PREOPERATIVE DIAGNOSIS: Left valgus impacted femoral neck fracture  POSTOPERATIVE DIAGNOSIS: Left valgus impacted femoral neck fracture  PROCEDURE(S): Open treatment of femoral fracture, proximal end, neck, with internal fixation (81829) Operative use of fluoroscopy for above procedure(s) (93716)  SURGEON: Ernestina Columbia, M.D.  ASSISTANT(S): None  ANESTHESIA: Spinal  FINDINGS: Preoperative Examination: Unable to ambulate normally due to pain in left hip.  Preoperative examination of the left hip and left lower extremity demonstrated pain in left hip with logroll.  Normal dorsiflexion, plantarflexion, and EHL function.  Sensation tact light touch in superficial peroneal, deep peroneal, and tibial distributions.  Normal DP and PT pulses.  Warm and well-perfused distally.  Operative Findings: Fluoroscopic examination prior to beginning the procedure confirmed presence of a valgus impacted femoral neck fracture of the left hip.  There is no interval displacement.  Fracture was deemed amenable to fixation with cannulated screws.  Orthogonal AP and lateral views of the femoral neck following cannulated screw fixation demonstrated no change in the position of the valgus impacted subcapital femoral neck fracture, and appropriate position of the cannulated screws without subchondral penetration.  IMPLANTS: Implant Name Type Inv. Item Serial No. Manufacturer Lot No. LRB No. Used Action  SCREW CANN 6.5X70MM - [id number] Screw SCREW CANN 6.5X70MM  [employer]  Left 2 Implanted  6.38mm 9mm x14mm SCREW    Synthes  Left 1 Implanted  WASHER FOR 5.0 SCREWS - [id number] Washer WASHER FOR 5.0 SCREWS  [employer]  Left 1 Implanted    INDICATIONS:  The patient is a 80 y.o. female who presented with a left valgus impacted femoral neck fracture after a mechanical fall.  She understood the risks, benefits and alternatives to surgery  which include but are not limited to bleeding, wound healing complications, infection, damage to surrounding structures, persistent pain, stiffness, lack of improvement, potential for subsequent arthritis or worsening of pre-existing arthritis, nonunion, malunion, and need for further surgery, as well as complications related to anesthesia, cardiovascular complications, and death.  She also understood the potential for continued pain in that there were no guarantees of acceptable outcome.  After weighing these risks the patient opted to proceed with surgery.  PROCEDURE: Patient was identified in the preoperative holding area.  The left hip and thigh was marked by myself.  Consent was signed by myself and the patient.  Block was performed by anesthesia in the preoperative holding area.  Patient was taken to the operative suite and placed supine on the operative table.  Anesthesia was induced by the anesthesia team.  The patient was positioned appropriately for the procedure and all bony prominences were well padded.  A tourniquet was not used.  Preoperative antibiotics were given. The extremity was prepped and draped in the usual sterile fashion and surgical timeout was performed.  Fluoroscopic examination was performed as described above.  Fracture was deemed amenable to fixation with cannulated screws.  The level of the lesser trochanter and the trajectory of the femoral neck were marked out on the skin.  Longitudinal incision was marked out laterally in the greater trochanteric region.  Skin was incised sharply.  Underlying subcutaneous tissues were dissected down to fascia with electrocautery.  Fascia was incised sharply with a deep knife.  Dissection was carried down onto bone.  Under fluoroscopic guidance, the initial inferior guidewire was placed with a trajectory along the femoral calcar.  This was advanced proximally into the proximal fragment without violating the subchondral  bone of the femoral head,  and position was confirmed on AP and lateral images fluoroscopically.  Using the double parallel guide, 2 additional wires were advanced into the proximal fragment from the lateral femoral cortex proximally, 1 anterior and 1 posterior, creating an inverted triangle configuration.  Appropriate position of these wires through the femoral neck and into the proximal fragment without subchondral penetration was again confirmed on orthogonal AP and lateral fluoroscopic views.  Screw measurements were obtained directly off the wires.  Measurements were between sizes, so the smaller length was selected due to the diminutive size of the patient's proximal femoral anatomy in order to avoid violating the subchondral bone of the proximal femur/femoral head.  The lateral cortex was drilled with a cannulated drill.  A 70 mm x 6.5 mm screw with 40mm thread was placed inferiorly.  A 70 mm x 6.5 mm screw with 2mm thread was placed in the proximal anterior position with a washer.  A 70 mm x 6.5 mm screw with 32 mm thread was placed in the proximal posterior position, because there were no more short thread screws.  All screws had excellent purchase.  Screw position was evaluated on AP and lateral fluoroscopic images of the proximal femur and appropriate position of hardware was confirmed.  Guidewires were withdrawn.  Fracture remained in stable position after fixation.  The wound was copiously irrigated, and excellent hemostasis was confirmed.  The wound was closed in layers, with a deep fascial closure followed by subsequent closure of the subcutaneous tissues and dermis.  The skin was closed with a running Monocryl suture and sealed with Dermabond.  Mepilex dressing was placed.  Patient was awakened from anesthesia, transferred to the hospital bed, and awakened from anesthesia.  She tolerated the procedure well.  There were no complications.  POST OPERATIVE INSTRUCTIONS: Mobility: Out of bed with PT/OT Pain control: Continue  to wean/titrate to appropriate oral regimen DVT Prophylaxis: Lovenox 30 mg twice daily x6 weeks Foley catheter status: Okay to remove according to spinal protocol, not indicated from orthopedic standpoint Further surgical plans: None RUE: Weightbearing as tolerated, range of motion as tolerated LUE: Weightbearing as tolerated, range of motion as tolerated RLE: Weightbearing as tolerated, range of motion as tolerated LLE: Weightbearing as tolerated, range of motion as tolerated Disposition: Per primary team Dressing care: Keep Mepilex on and dry for 5 days.  Do not allow surgical area to get wet before that.  Remove Mepilex dressing after 5 days and allow area to get wet in shower but DO NOT SUBMERGE. Follow-up: Please call [facility] [phone]) to schedule follow-op appointment for 2 weeks after surgery.   TOURNIQUET TIME: N/A  BLOOD LOSS: 50 mL         DRAINS: none         SPECIMEN: none       COMPLICATIONS:  * No complications entered in OR log *         DISPOSITION: PACU - hemodynamically stable.         CONDITION: stable   Ernestina Columbia M.D. Orthopaedic Surgery [facility]

## 2020-11-17 LAB — CBC
HCT: 31 % — ABNORMAL LOW (ref 36.0–46.0)
Hemoglobin: 10.1 g/dL — ABNORMAL LOW (ref 12.0–15.0)
MCH: 29.5 pg (ref 26.0–34.0)
MCHC: 32.6 g/dL (ref 30.0–36.0)
MCV: 90.6 fL (ref 80.0–100.0)
Platelets: 294 10*3/uL (ref 150–400)
RBC: 3.42 MIL/uL — ABNORMAL LOW (ref 3.87–5.11)
RDW: 13.7 % (ref 11.5–15.5)
WBC: 8 10*3/uL (ref 4.0–10.5)
nRBC: 0 % (ref 0.0–0.2)

## 2020-11-17 LAB — COMPREHENSIVE METABOLIC PANEL
ALT: 16 U/L (ref 0–44)
AST: 23 U/L (ref 15–41)
Albumin: 3.2 g/dL — ABNORMAL LOW (ref 3.5–5.0)
Alkaline Phosphatase: 57 U/L (ref 38–126)
Anion gap: 8 (ref 5–15)
BUN: 19 mg/dL (ref 8–23)
CO2: 22 mmol/L (ref 22–32)
Calcium: 8.6 mg/dL — ABNORMAL LOW (ref 8.9–10.3)
Chloride: 107 mmol/L (ref 98–111)
Creatinine, Ser: 0.89 mg/dL (ref 0.44–1.00)
GFR, Estimated: >60 mL/min (ref >60)
Glucose, Bld: 143 mg/dL — ABNORMAL HIGH (ref 70–99)
Potassium: 3.6 mmol/L (ref 3.5–5.1)
Sodium: 137 mmol/L (ref 135–145)
Total Bilirubin: 0.6 mg/dL (ref 0.3–1.2)
Total Protein: 6 g/dL — ABNORMAL LOW (ref 6.5–8.1)

## 2020-11-17 MED ORDER — LORAZEPAM 0.5 MG PO TABS
0.5000 mg | ORAL_TABLET | Freq: Once | ORAL | Status: AC
  Administered 2020-11-17: 23:00:00 0.5 mg via ORAL
  Filled 2020-11-17: qty 1

## 2020-11-17 NOTE — Evaluation (Signed)
Physical Therapy Evaluation Patient Details Name: Sandy Summers MRN: [id number] DOB: [birth year] Today's Date: 11/17/2020  History of Present Illness   Patient is 80 y.o. female s/p Lt hip pinning on 11/16/20 due to femoral neck fracture after a fall. PMH significant for HTN, thyroid disease.   Clinical Impression  Sandy Summers is a 80 y.o. female POD 1 s/p Lt hip cannulated pinning. Patient reports independence with mobility at baseline. Patient is now limited by functional impairments (see PT problem list below) and requires min guard/supervision for transfers and gait with RW. Patient was able to ambulate ~180 feet with RW and min guard. Patient will benefit from continued skilled PT interventions to address impairments and progress towards PLOF. Acute PT will follow to progress mobility and stair training in preparation for safe discharge home.        Recommendations for follow up therapy are one component of a multi-disciplinary discharge planning process, led by the attending physician.  Recommendations may be updated based on patient status, additional functional criteria and insurance authorization.  Follow Up Recommendations Outpatient PT    Assistance Recommended at Discharge Intermittent Supervision/Assistance  Functional Status Assessment Patient has had a recent decline in their functional status and demonstrates the ability to make significant improvements in function in a reasonable and predictable amount of time.  Equipment Recommendations  Rolling walker (2 wheels) (youth walker)    Recommendations for Other Services       Precautions / Restrictions Precautions Precautions: Fall Restrictions Weight Bearing Restrictions: No Other Position/Activity Restrictions: WBAT      Mobility  Bed Mobility Overal bed mobility: Needs Assistance Bed Mobility: Supine to Sit     Supine to sit: Supervision;HOB elevated     General bed mobility comments: HOB slightly  elevated, supervision for safety.    Transfers Overall transfer level: Needs assistance Equipment used: Rolling walker (2 wheels);None Transfers: Sit to/from Stand;Bed to chair/wheelchair/BSC Sit to Stand: Min guard;Supervision   Step pivot transfers: Min guard;Supervision      Cues for technique to rise to RW for sit<>stands, no assist required for power-up.      Ambulation/Gait Ambulation/Gait assistance: Min guard Gait Distance (Feet): 180 Feet Assistive device: Rolling walker (2 wheels);1 person hand held assist Gait Pattern/deviations: Step-through pattern;Decreased stride length;Decreased weight shift to right;Antalgic Gait velocity: decr      Cues for step to pattern and safe proximity to RW. Pt progressed to step through pattern with no LOB noted. Pt ambulated short distance with HHA and gait more antalgic. Recommend pt continue with RW to manage pain and improve balance for safe recovery.  Stairs            Wheelchair Mobility    Modified Rankin (Stroke Patients Only)       Balance Overall balance assessment: Mild deficits observed, not formally tested;History of Falls;Needs assistance Sitting-balance support: Feet supported Sitting balance-Leahy Scale: Good     Standing balance support: Single extremity supported;During functional activity;Reliant on assistive device for balance;Bilateral upper extremity supported Standing balance-Leahy Scale: Fair                               Pertinent Vitals/Pain Pain Assessment: Faces Faces Pain Scale: Hurts a little bit Pain Location: Lt hip Pain Descriptors / Indicators: Aching;Discomfort Pain Intervention(s): Limited activity within patient's tolerance;Monitored during session;Repositioned;Ice applied    Home Living Family/patient expects to be discharged to:: Private residence Living Arrangements: Spouse/significant other  Available Help at Discharge: Family Type of Home: House Home Access:  Stairs to enter Entrance Stairs-Rails: None Entrance Stairs-Number of Steps: 1 Alternate Level Stairs-Number of Steps: stays on main Home Layout: Two level;Full bath on main level;Able to live on main level with bedroom/bathroom Home Equipment: Grab bars - tub/shower      Prior Function Prior Level of Function : Independent/Modified Independent             Mobility Comments: golfs regularly       Hand Dominance   Dominant Hand: Right    Extremity/Trunk Assessment   Upper Extremity Assessment Upper Extremity Assessment: Overall WFL for tasks assessed    Lower Extremity Assessment Lower Extremity Assessment: Overall WFL for tasks assessed    Cervical / Trunk Assessment Cervical / Trunk Assessment: Normal  Communication   Communication: No difficulties  Cognition Arousal/Alertness: Awake/alert Behavior During Therapy: WFL for tasks assessed/performed Overall Cognitive Status: Within Functional Limits for tasks assessed                                          General Comments      Exercises     Assessment/Plan    PT Assessment Patient needs continued PT services  PT Problem List Decreased strength;Decreased activity tolerance;Decreased balance;Decreased mobility;Decreased knowledge of use of DME;Decreased safety awareness;Decreased knowledge of precautions       PT Treatment Interventions DME instruction;Gait training;Stair training;Functional mobility training;Therapeutic activities;Therapeutic exercise;Balance training;Patient/family education    PT Goals (Current goals can be found in the Care Plan section)  Acute Rehab PT Goals Patient Stated Goal: get back to golf, walking 4 miles/day, and walking her dogs PT Goal Formulation: With patient Time For Goal Achievement: 11/24/20 Potential to Achieve Goals: Good    Frequency Min 3X/week   Barriers to discharge        Co-evaluation               AM-PAC PT "6 Clicks" Mobility   Outcome Measure Help needed turning from your back to your side while in a flat bed without using bedrails?: None Help needed moving from lying on your back to sitting on the side of a flat bed without using bedrails?: None Help needed moving to and from a bed to a chair (including a wheelchair)?: A Little Help needed standing up from a chair using your arms (e.g., wheelchair or bedside chair)?: A Little Help needed to walk in hospital room?: A Little Help needed climbing 3-5 steps with a railing? : A Little 6 Click Score: 20    End of Session Equipment Utilized During Treatment: Gait belt Activity Tolerance: Patient tolerated treatment well Patient left: in chair;with call bell/phone within reach;with family/visitor present Nurse Communication: Mobility status PT Visit Diagnosis: Other abnormalities of gait and mobility (R26.89);Difficulty in walking, not elsewhere classified (R26.2);History of falling (Z91.81)    Time: 8366-2947 PT Time Calculation (min) (ACUTE ONLY): 27 min   Charges:   PT Evaluation $PT Eval Low Complexity: 1 Low PT Treatments $Gait Training: 8-22 mins        Wynn Maudlin, DPT Acute Rehabilitation Services Office [phone] Pager [phone]   Anitra Lauth 11/17/2020, 11:17 AM

## 2020-11-17 NOTE — Progress Notes (Signed)
Orthopaedics Daily Progress Note   11/17/2020   6:57 AM  Sandy Summers is a 80 y.o. female 1 Day Post-Op s/p CANNULATED HIP PINNING  Subjective Pain well controlled.  Denies nausea, vomiting, or fevers.   Objective Vitals:   11/16/20 2012 11/17/20 0337  BP: 133/66 (!) 152/65  Pulse: 88 65  Resp: 20 18  Temp: 97.6 F (36.4 C) 98.2 F (36.8 C)  SpO2: 98% 93%    Intake/Output Summary (Last 24 hours) at 11/17/2020 0109 Last data filed at 11/17/2020 3235 Gross per 24 hour  Intake 1340 ml  Output 700 ml  Net 640 ml    Physical Exam LLE: Dressing clean, dry, and intact +DF/PF/EHL SILT SP/DP/T +DP/PT and WWP distally  Assessment 80 y.o. female s/p Procedure(s) (LRB): CANNULATED HIP PINNING (Left)  Plan Mobility: OOB, PT/OT Pain control: Continue to wean/titrate to appropriate oral regimen DVT Prophylaxis: Lovenox 30 mg BID x6 weeks Foley catheter status: Not indicated from ortho perspective Further surgical plans: None RUE: WBAT, ROMAT LUE: WBAT, ROMAT RLE: WBAT, ROMAT LLE: WBAT, ROMAT Disposition: Per primary team Dressing care: Keep Mepilex on and dry for 5 days.  Do not allow surgical area to get wet before that.  Remove Mepilex dressing after 5 days and allow area to get wet in shower but DO NOT SUBMERGE. Follow-up: Please call [facility] [phone]) to schedule follow-op appointment for 2 weeks after surgery.   Ernestina Columbia M.D. Orthopaedic Surgery [facility]

## 2020-11-17 NOTE — Plan of Care (Signed)

## 2020-11-17 NOTE — Progress Notes (Signed)
PROGRESS NOTE    Sandy Summers  ZLD:[id number] DOB: 31-Mar-1940 DOA: 11/15/2020 PCP: Pcp, No    Brief Narrative:  80 y.o. female with medical history significant of hypertension, hyperlipidemia, hypothyroidism, CAD status post PCI in 2002, osteoporosis presented to the ED complaining of left hip pain after a mechanical fall last week.  Vital signs stable.  Labs showing WBC 4.5, hemoglobin 11.5, platelet count 275k.  Sodium 137, potassium 4.6, chloride 104, bicarb 25, BUN 20, creatinine 0.8, glucose 92.  Screening COVID and influenza PCR negative.  X-ray of left hip/pelvis showing acute left subcapital femoral neck fracture after tripping over walking dogs. Pt planned for surgery 11/16/20  Assessment & Plan:   Principal Problem:   Closed left hip fracture, initial encounter [facility]) Active Problems:   CAD (coronary artery disease)   Normocytic anemia   Essential hypertension   Hypothyroidism  Acute left subcapital femoral neck fracture -Secondary to mechanical fall -Neurovascularly intact -Orthopedics following. Pt now s/p Open tx and internal fixation of L femoral neck fracture -PT/OT eval pending   CAD status post PCI in 2002 -Not endorsing any anginal symptoms. -EKG without acute changes. -Echo done in September 2021 (Care Everywhere) showing LVEF 65 to 70% with prolonged relaxation, no wall motion abnormalities, mild to moderate aortic stenosis, trace mitral regurgitation, and trace tricuspid regurgitation. -Patient appears euvolemic at this time.  Recent chest x-ray was negative for acute finding.   Normocytic anemia -Hemoglobin currently 11.5, MCV 93.4.  Hemoglobin was 12.5 on labs done in August 2022 and previously 11.9 in April 2022. -No signs of active bleeding -Not endorsing any symptoms of GI bleed -Iron levels within normal limits -Hgb stable post-op   Hypertension -Stable -Continue metoprolol and lisinopril   Hyperlipidemia -LDL 53 on 08/01/2020 -Continue  Lipitor   Hypothyroidism -TSH normal on labs done 08/01/2020 -Continue Synthroid   Osteoporosis -Per note from PCP (Care Everywhere), history of intolerance to alendronate and Prolia and patient did not want to consider Reclast due to side effects. -Continue vitamin D supplement as tolerated  Constipation -This AM, reports difficulty moving bowels, last bm was day before admit with small hard stool noted -Continue cathartics as needed -Reports good stool output yesterday   DVT prophylaxis: SCD's Code Status: DNR Family Communication: Pt in room, family at bedside  Status is: Inpatient  Remains inpatient appropriate because: Severity of illness requiring surgery   Consultants:  Orthopedic Surgery  Procedures:    Antimicrobials: Anti-infectives (From admission, onward)    Start     Dose/Rate Route Frequency Ordered Stop   11/16/20 2200  ceFAZolin (ANCEF) IVPB 2g/100 mL premix        2 g 200 mL/hr over 30 Minutes Intravenous Every 6 hours 11/16/20 1841 11/17/20 0429   11/16/20 1400  ceFAZolin (ANCEF) IVPB 2g/100 mL premix        2 g 200 mL/hr over 30 Minutes Intravenous On call to O.R. 11/16/20 1131 11/16/20 1633       Subjective: Reported BM yesterday after cathartics given  Objective: Vitals:   11/16/20 1845 11/16/20 1900 11/16/20 2012 11/17/20 0337  BP: (!) 154/87 (!) 158/77 133/66 (!) 152/65  Pulse: 75 80 88 65  Resp: 14 18 20 18   Temp:  98 F (36.7 C) 97.6 F (36.4 C) 98.2 F (36.8 C)  TempSrc:   Axillary Oral  SpO2: 100% 97% 98% 93%  Weight:      Height:        Intake/Output Summary (Last 24 hours)  at 11/17/2020 1444 Last data filed at 11/17/2020 0370 Gross per 24 hour  Intake 1340 ml  Output 700 ml  Net 640 ml    Filed Weights   11/15/20 0848 11/16/20 1358  Weight: 48.5 kg 48.5 kg    Examination: General exam: Conversant, in no acute distress Respiratory system: normal chest rise, clear, no audible wheezing Cardiovascular system: regular  rhythm, s1-s2 Gastrointestinal system: Nondistended, nontender, pos BS Central nervous system: No seizures, no tremors Extremities: No cyanosis, no joint deformities Skin: No rashes, no pallor Psychiatry: Affect normal // no auditory hallucinations   Data Reviewed: I have personally reviewed following labs and imaging studies  CBC: Recent Labs  Lab 11/15/20 1008 11/17/20 0456  WBC 4.5 8.0  NEUTROABS 3.1  --   HGB 11.5* 10.1*  HCT 35.3* 31.0*  MCV 93.4 90.6  PLT 275 294    Basic Metabolic Panel: Recent Labs  Lab 11/15/20 1008 11/17/20 0456  NA 137 137  K 4.6 3.6  CL 104 107  CO2 25 22  GLUCOSE 92 143*  BUN 20 19  CREATININE 0.84 0.89  CALCIUM 8.9 8.6*    GFR: Estimated Creatinine Clearance: 35 mL/min (by C-G formula based on SCr of 0.89 mg/dL). Liver Function Tests: Recent Labs  Lab 11/17/20 0456  AST 23  ALT 16  ALKPHOS 57  BILITOT 0.6  PROT 6.0*  ALBUMIN 3.2*   No results for input(s): LIPASE, AMYLASE in the last 168 hours. No results for input(s): AMMONIA in the last 168 hours. Coagulation Profile: No results for input(s): INR, PROTIME in the last 168 hours. Cardiac Enzymes: No results for input(s): CKTOTAL, CKMB, CKMBINDEX, TROPONINI in the last 168 hours. BNP (last 3 results) No results for input(s): PROBNP in the last 8760 hours. HbA1C: No results for input(s): HGBA1C in the last 72 hours. CBG: No results for input(s): GLUCAP in the last 168 hours. Lipid Profile: No results for input(s): CHOL, HDL, LDLCALC, TRIG, CHOLHDL, LDLDIRECT in the last 72 hours. Thyroid Function Tests: No results for input(s): TSH, T4TOTAL, FREET4, T3FREE, THYROIDAB in the last 72 hours. Anemia Panel: Recent Labs    11/16/20 0505  VITAMINB12 191  FOLATE 14.2  FERRITIN 13  TIBC 319  IRON 35  RETICCTPCT 2.1    Sepsis Labs: No results for input(s): PROCALCITON, LATICACIDVEN in the last 168 hours.  Recent Results (from the past 240 hour(s))  Resp Panel by  RT-PCR (Flu A&B, Covid) Nasopharyngeal Swab     Status: None   Collection Time: 11/15/20 10:08 AM   Specimen: Nasopharyngeal Swab; Nasopharyngeal(NP) swabs in vial transport medium  Result Value Ref Range Status   SARS Coronavirus 2 by RT PCR NEGATIVE NEGATIVE Final    Comment: (NOTE) SARS-CoV-2 target nucleic acids are NOT DETECTED.  The SARS-CoV-2 RNA is generally detectable in upper respiratory specimens during the acute phase of infection. The lowest concentration of SARS-CoV-2 viral copies this assay can detect is 138 copies/mL. A negative result does not preclude SARS-Cov-2 infection and should not be used as the sole basis for treatment or other patient management decisions. A negative result may occur with  improper specimen collection/handling, submission of specimen other than nasopharyngeal swab, presence of viral mutation(s) within the areas targeted by this assay, and inadequate number of viral copies(<138 copies/mL). A negative result must be combined with clinical observations, patient history, and epidemiological information. The expected result is Negative.  Fact Sheet for Patients:  BloggerCourse.com  Fact Sheet for Healthcare Providers:  SeriousBroker.it  This test is no t yet approved or cleared by the [place] and  has been authorized for detection and/or diagnosis of SARS-CoV-2 by FDA under an Emergency Use Authorization (EUA). This EUA will remain  in effect (meaning this test can be used) for the duration of the COVID-19 declaration under Section 564(b)(1) of the Act, 21 U.S.C.section 360bbb-3(b)(1), unless the authorization is terminated  or revoked sooner.       Influenza A by PCR NEGATIVE NEGATIVE Final   Influenza B by PCR NEGATIVE NEGATIVE Final    Comment: (NOTE) The Xpert Xpress SARS-CoV-2/FLU/RSV plus assay is intended as an aid in the diagnosis of influenza from Nasopharyngeal swab  specimens and should not be used as a sole basis for treatment. Nasal washings and aspirates are unacceptable for Xpert Xpress SARS-CoV-2/FLU/RSV testing.  Fact Sheet for Patients: BloggerCourse.com  Fact Sheet for Healthcare Providers: SeriousBroker.it  This test is not yet approved or cleared by the [place] FDA and has been authorized for detection and/or diagnosis of SARS-CoV-2 by FDA under an Emergency Use Authorization (EUA). This EUA will remain in effect (meaning this test can be used) for the duration of the COVID-19 declaration under Section 564(b)(1) of the Act, 21 U.S.C. section 360bbb-3(b)(1), unless the authorization is terminated or revoked.  Performed at [facility], [address], [place], [place] [address]       Radiology Studies: DG C-Arm 1-60 Min-No Report  Result Date: 11/16/2020 CLINICAL DATA:  Operative fluoroscopic imaging provided for left proximal femur fracture ORIF. EXAM: OPERATIVE LEFT HIP (WITH PELVIS IF PERFORMED) 4 VIEWS TECHNIQUE: Fluoroscopic spot image(s) were submitted for interpretation post-operatively. COMPARISON:  11/15/2020 FINDINGS: Images show placement of 3 screws fixating the subcapital left femoral neck fracture. IMPRESSION: 1. Well-positioned left subcapital femoral neck fracture following ORIF. Electronically Signed   By: Amie Portland M.D.   On: 11/16/2020 17:40   DG HIP OPERATIVE UNILAT WITH PELVIS LEFT  Result Date: 11/16/2020 CLINICAL DATA:  Operative fluoroscopic imaging provided for left proximal femur fracture ORIF. EXAM: OPERATIVE LEFT HIP (WITH PELVIS IF PERFORMED) 4 VIEWS TECHNIQUE: Fluoroscopic spot image(s) were submitted for interpretation post-operatively. COMPARISON:  11/15/2020 FINDINGS: Images show placement of 3 screws fixating the subcapital left femoral neck fracture. IMPRESSION: 1. Well-positioned left subcapital femoral neck fracture following  ORIF. Electronically Signed   By: Amie Portland M.D.   On: 11/16/2020 17:40    Scheduled Meds:  acetaminophen  500 mg Oral Q6H   atorvastatin  20 mg Oral Daily   cholecalciferol  1,000 Units Oral Daily   docusate sodium  100 mg Oral BID   enoxaparin (LOVENOX) injection  40 mg Subcutaneous Q24H   feeding supplement  237 mL Oral BID BM   levothyroxine  50 mcg Oral Q0600   lisinopril  5 mg Oral Daily   metoprolol succinate  50 mg Oral QHS   multivitamin with minerals  1 tablet Oral Daily   Continuous Infusions:     LOS: 2 days   Rickey Barbara, MD [facility] Pager On Amion  If 7PM-7AM, please contact night-coverage 11/17/2020, 2:44 PM

## 2020-11-17 NOTE — Discharge Instructions (Addendum)
Discharge instructions for Dr. Ernestina Columbia, M.D.  Diet: As you were doing prior to hospitalization, unless instructed otherwise by medical team, dietary/nutrition team, etc. Shower:  Unless otherwise specified, may shower but keep the wounds dry, use an occlusive plastic wrap, NO SOAKING IN TUB.  If the bandage gets wet, change with a clean dry gauze.  Unless otherwise specified, after 5 days dressing(s) should be removed and wound(s) may get wet in the shower by allowing water to gently run over.  Again, no soaking in tub, and do NOT submerge for at least 2 weeks!!! Dressing:  Unless otherwise specified, may change your dressing 5 days after surgery.  In most cases skin glue is used and no additional dressing is necessary.  If there are sticky tapes (steri-strips) on your wounds and all the stitches are absorbable.  Leave the steri-strips in place when changing your dressings, they will peel off with time, usually 2-3 weeks. Activity:  Increase activity slowly as tolerated, but follow the restrictions on the two-sided paper discharge instructions sheet that Dr. Sherilyn Dacosta placed in the paper chart.  No lifting or driving for 6 weeks, or until discussed with surgeon. Weight Bearing: Weightbearing as tolerated  To prevent constipation: You may use over-the-counter stool softener(s) such as Colace (over the counter) 100 mg by mouth twice a day and/or Miralax (over the counter) for constipation as needed.  Drink plenty of fluids (prune juice may be helpful) and high fiber foods.  Itching:  If you experience itching with your medications, try taking only a single pain pill, or even half a pain pill at a time.  You can also use benadryl over the counter for itching or also to help with sleep.  Precautions:  If you experience chest pain or shortness of breath - call 911 immediately for transfer to the hospital emergency department!! Medications: Please contact the clinic during office hours (Monday through Friday,  0800 to 1600) if you need a refill on any medications.  Please monitor medications and allow 24 to 48 hours to process refill request!!!!  Please note that only medications directly related to the surgery can be prescribed.  For other medications (e.g. blood pressure medicines, sleeping medicines, etc.), please contact the prescribing physician or your primary care provider.  If you develop a fever greater that 101.1 deg F, purulent drainage from wound, increased redness or drainage from wound, or calf pain -- Call the office at [phone].

## 2020-11-18 MED ORDER — ENOXAPARIN SODIUM 40 MG/0.4ML IJ SOSY: 40.0000 mg | PREFILLED_SYRINGE | INTRAMUSCULAR | 0 refills | Status: DC

## 2020-11-18 MED ORDER — DOCUSATE SODIUM 100 MG PO CAPS
100.0000 mg | ORAL_CAPSULE | Freq: Two times a day (BID) | ORAL | 0 refills | Status: AC
Start: 2020-11-18 — End: 2020-12-18

## 2020-11-18 MED ORDER — HYDROCODONE-ACETAMINOPHEN 5-325 MG PO TABS
1.0000 | ORAL_TABLET | Freq: Four times a day (QID) | ORAL | 0 refills | Status: DC | PRN
Start: 2020-11-18 — End: 2021-08-08

## 2020-11-18 MED ORDER — SENNOSIDES-DOCUSATE SODIUM 8.6-50 MG PO TABS: 1.0000 | ORAL_TABLET | Freq: Every evening | ORAL | 0 refills | Status: AC | PRN

## 2020-11-18 NOTE — Care Management Important Message (Signed)
Important Message  Patient Details IM Letter placed in Patients room. Name: Sandy Summers MRN: [id number] Date of Birth: Sep 10, 1940   Medicare Important Message Given:  Yes     Caren Macadam 11/18/2020, 12:19 PM

## 2020-11-18 NOTE — TOC Transition Note (Signed)
Transition of Care [facility]) - CM/SW Discharge Note   Patient Details  Name: Sandy Summers MRN: [id number] Date of Birth: 1940/08/29  [facility] CM/SW Contact:  Ida Rogue, LCSW Phone Number: 11/18/2020, 12:54 PM   Clinical Narrative:   Patient who is stable for d/c today is in need of Outpatient PT, youth Rolling Walker.  Orders seen and appreciated.  Outpt. PT orders Faxed to [facility] clinic in [facility].  [facility] will deliver walker to room.  No further needs identified.  TOC sign off.    Final next level of care: Home/Self Care Barriers to Discharge: No Barriers Identified   Patient Goals and CMS Choice        Discharge Placement                       Discharge Plan and Services                                     Social Determinants of Health (SDOH) Interventions     Readmission Risk Interventions No flowsheet data found.

## 2020-11-18 NOTE — Discharge Summary (Signed)
Physician Discharge Summary  Sandy Summers UEA:[id number] DOB: 11/06/1940 DOA: 11/15/2020  PCP: Pcp, No  Admit date: 11/15/2020 Discharge date: 11/18/2020  Admitted From: Home Disposition:  Home  Recommendations for Outpatient Follow-up:  Follow up with PCP in 1-2 weeks Follow up with Orthopedic Surgery as needed  Home Health:PT   Discharge Condition:Stable CODE STATUS:DNR Diet recommendation: Heart healthy   Brief/Interim Summary: 80 y.o. female with medical history significant of hypertension, hyperlipidemia, hypothyroidism, CAD status post PCI in 2002, osteoporosis presented to the ED complaining of left hip pain after a mechanical fall last week.  Vital signs stable.  Labs showing WBC 4.5, hemoglobin 11.5, platelet count 275k.  Sodium 137, potassium 4.6, chloride 104, bicarb 25, BUN 20, creatinine 0.8, glucose 92.  Screening COVID and influenza PCR negative.  X-ray of left hip/pelvis showing acute left subcapital femoral neck fracture after tripping over walking dogs. Pt planned for surgery 11/16/20   Discharge Diagnoses:  Principal Problem:   Closed left hip fracture, initial encounter [facility]) Active Problems:   CAD (coronary artery disease)   Normocytic anemia   Essential hypertension   Hypothyroidism  Acute left subcapital femoral neck fracture -Secondary to mechanical fall -Neurovascularly intact -Orthopedics following. Pt now s/p Open tx and internal fixation of L femoral neck fracture -PT/OT recs for home PT   CAD status post PCI in 2002 -Not endorsing any anginal symptoms. -EKG without acute changes. -Echo done in September 2021 (Care Every[facility]) showing LVEF 65 to 70% with prolonged relaxation, no wall motion abnormalities, mild to moderate aortic stenosis, trace mitral regurgitation, and trace tricuspid regurgitation. -Patient appears euvolemic at this time.  Recent chest x-ray was negative for acute finding.   Normocytic anemia -Hemoglobin currently 11.5, MCV  93.4.  Hemoglobin was 12.5 on labs done in August 2022 and previously 11.9 in April 2022. -No signs of active bleeding -Not endorsing any symptoms of GI bleed -Iron levels within normal limits -Hgb stable post-op   Hypertension -Remained stable -Continue metoprolol and lisinopril   Hyperlipidemia -LDL 53 on 08/01/2020 -Continue Lipitor   Hypothyroidism -TSH normal on labs done 08/01/2020 -Continue Synthroid   Osteoporosis -Per note from PCP ([facility] Every[facility]), history of intolerance to alendronate and Prolia and patient did not want to consider Reclast due to side effects. -Continue vitamin D supplement as tolerated   Constipation -This AM, reports difficulty moving bowels, last bm was day before admit with small hard stool noted -Reports good stool output recently -Recommend cathartics as needed   Discharge Instructions  Discharge Instructions     Ambulatory referral to Physical Therapy   Complete by: As directed    PT-[facility] on [address]      Allergies as of 11/18/2020       Reactions   Alendronate Nausea Only   Amoxicillin-pot Clavulanate Diarrhea   Denosumab Other (See Comments)   I just didn't like it    Gabapentin Other (See Comments)   Ineffective for sleep "made me crazy"        Medication List     STOP taking these medications    betamethasone dipropionate 0.05 % cream   triamcinolone cream 0.1 % Commonly known as: KENALOG       TAKE these medications    aspirin 81 MG chewable tablet Chew 81 mg by mouth daily.   atorvastatin 20 MG tablet Commonly known as: LIPITOR Take 20 mg by mouth daily.   Cholecalciferol 25 MCG (1000 UT) tablet Take 1,000 Units by mouth daily.  docusate sodium 100 MG capsule Commonly known as: COLACE Take 1 capsule (100 mg total) by mouth 2 (two) times daily.   enoxaparin 40 MG/0.4ML injection Commonly known as: LOVENOX Inject 0.4 mLs (40 mg total) into the skin daily. Start taking on: November 19, 2020   HYDROcodone-acetaminophen 5-325 MG tablet Commonly known as: NORCO/VICODIN Take 1-2 tablets by mouth every 6 (six) hours as needed for moderate pain.   levothyroxine 50 MCG tablet Commonly known as: SYNTHROID Take 50 mcg by mouth daily.   lisinopril 5 MG tablet Commonly known as: ZESTRIL Take 5 mg by mouth daily.   LORazepam 1 MG tablet Commonly known as: ATIVAN Take 0.5 mg by mouth at bedtime as needed for anxiety. For sleep   metoprolol succinate 50 MG 24 hr tablet Commonly known as: TOPROL-XL Take 50 mg by mouth at bedtime.   PRESERVISION AREDS 2 PO Take 1 capsule by mouth daily.   senna-docusate 8.6-50 MG tablet Commonly known as: Senokot-S Take 1 tablet by mouth at bedtime as needed for mild constipation.               Durable Medical Equipment  (From admission, onward)           Start     Ordered   11/18/20 1203  For home use only DME Walker youth  Once       Question:  Patient needs a walker to treat with the following condition  Answer:  Closed displaced fracture of left femoral neck (HCC)   11/18/20 1202            Follow-up Information     Ernestina Columbia, MD. Schedule an appointment as soon as possible for a visit.   Specialty: Orthopedic Surgery Why: Please call the office to schedule an appointment for 2 weeks from the date of surgery. Contact information: [address] [place] [address] [phone]         Follow up with your PCP in 1-2 weeks Follow up.   Why: Hospital follow up               Allergies  Allergen Reactions   Alendronate Nausea Only   Amoxicillin-Pot Clavulanate Diarrhea   Denosumab Other (See Comments)    I just didn't like it    Gabapentin Other (See Comments)    Ineffective for sleep "made me crazy"    Consultations: Orthopedic Surgery  Procedures/Studies: DG Chest 1 View  Result Date: 11/15/2020 CLINICAL DATA:  Fall, left hip pain.  Reduced mobility. EXAM: CHEST  1  VIEW COMPARISON:  04/29/2020 FINDINGS: Atherosclerotic calcification of the aortic arch. Mild lower thoracic and upper lumbar spondylosis. The lungs appear clear. Heart size within normal limits. No blunting of the costophrenic angles. IMPRESSION: 1. No acute findings. 2. Atherosclerosis is present, including aortoiliac atherosclerotic disease. Electronically Signed   By: Gaylyn Rong M.D.   On: 11/15/2020 09:27   DG C-Arm 1-60 Min-No Report  Result Date: 11/16/2020 CLINICAL DATA:  Operative fluoroscopic imaging provided for left proximal femur fracture ORIF. EXAM: OPERATIVE LEFT HIP (WITH PELVIS IF PERFORMED) 4 VIEWS TECHNIQUE: Fluoroscopic spot image(s) were submitted for interpretation post-operatively. COMPARISON:  11/15/2020 FINDINGS: Images show placement of 3 screws fixating the subcapital left femoral neck fracture. IMPRESSION: 1. Well-positioned left subcapital femoral neck fracture following ORIF. Electronically Signed   By: Amie Portland M.D.   On: 11/16/2020 17:40   DG HIP OPERATIVE UNILAT WITH PELVIS LEFT  Result Date: 11/16/2020 CLINICAL DATA:  Operative fluoroscopic imaging provided for left proximal femur fracture ORIF. EXAM: OPERATIVE LEFT HIP (WITH PELVIS IF PERFORMED) 4 VIEWS TECHNIQUE: Fluoroscopic spot image(s) were submitted for interpretation post-operatively. COMPARISON:  11/15/2020 FINDINGS: Images show placement of 3 screws fixating the subcapital left femoral neck fracture. IMPRESSION: 1. Well-positioned left subcapital femoral neck fracture following ORIF. Electronically Signed   By: Amie Portland M.D.   On: 11/16/2020 17:40   DG Hip Unilat With Pelvis 2-3 Views Left  Result Date: 11/15/2020 CLINICAL DATA:  Fall last week, left hip pain.  Reduced mobility. EXAM: DG HIP (WITH OR WITHOUT PELVIS) 2-3V LEFT COMPARISON:  Reduced mobility. FINDINGS: Acute mildly displaced subcapital femoral neck fracture on the left. Mild spurring of both acetabula. Atherosclerotic vascular  calcifications noted. Bony demineralization is present. IMPRESSION: 1. Acute left subcapital femoral neck fracture. 2. Bilateral acetabular spurring. 3. Aortic Atherosclerosis (ICD10-I70.0). Common iliac artery atherosclerosis. Electronically Signed   By: Gaylyn Rong M.D.   On: 11/15/2020 09:25    Subjective: Very eager to go home  Discharge Exam: Vitals:   11/17/20 2026 11/18/20 0343  BP: 137/77 (!) 159/67  Pulse: 80 70  Resp: 19 18  Temp: 98.4 F (36.9 C) 97.8 F (36.6 C)  SpO2: 97% 96%   Vitals:   11/17/20 0337 11/17/20 1330 11/17/20 2026 11/18/20 0343  BP: (!) 152/65 (!) 141/71 137/77 (!) 159/67  Pulse: 65 62 80 70  Resp: 18 19 19 18   Temp: 98.2 F (36.8 C) 98.5 F (36.9 C) 98.4 F (36.9 C) 97.8 F (36.6 C)  TempSrc: Oral Oral Oral Oral  SpO2: 93% 95% 97% 96%  Weight:      Height:        General: Pt is alert, awake, not in acute distress Cardiovascular: RRR, S1/S2 + Respiratory: CTA bilaterally, no wheezing, no rhonchi Abdominal: Soft, NT, ND, bowel sounds + Extremities: no edema, no cyanosis   The results of significant diagnostics from this hospitalization (including imaging, microbiology, ancillary and laboratory) are listed below for reference.     Microbiology: Recent Results (from the past 240 hour(s))  Resp Panel by RT-PCR (Flu A&B, Covid) Nasopharyngeal Swab     Status: None   Collection Time: 11/15/20 10:08 AM   Specimen: Nasopharyngeal Swab; Nasopharyngeal(NP) swabs in vial transport medium  Result Value Ref Range Status   SARS Coronavirus 2 by RT PCR NEGATIVE NEGATIVE Final    Comment: (NOTE) SARS-CoV-2 target nucleic acids are NOT DETECTED.  The SARS-CoV-2 RNA is generally detectable in upper respiratory specimens during the acute phase of infection. The lowest concentration of SARS-CoV-2 viral copies this assay can detect is 138 copies/mL. A negative result does not preclude SARS-Cov-2 infection and should not be used as the sole basis  for treatment or other patient management decisions. A negative result may occur with  improper specimen collection/handling, submission of specimen other than nasopharyngeal swab, presence of viral mutation(s) within the areas targeted by this assay, and inadequate number of viral copies(<138 copies/mL). A negative result must be combined with clinical observations, patient history, and epidemiological information. The expected result is Negative.  Fact Sheet for Patients:  11/17/20  Fact Sheet for Healthcare Providers:  BloggerCourse.com  This test is no t yet approved or cleared by the [place] FDA and  has been authorized for detection and/or diagnosis of SARS-CoV-2 by FDA under an Emergency Use Authorization (EUA). This EUA will remain  in effect (meaning this test can be used) for the duration of the COVID-19 declaration  under Section 564(b)(1) of the Act, 21 U.S.C.section 360bbb-3(b)(1), unless the authorization is terminated  or revoked sooner.       Influenza A by PCR NEGATIVE NEGATIVE Final   Influenza B by PCR NEGATIVE NEGATIVE Final    Comment: (NOTE) The Xpert Xpress SARS-CoV-2/FLU/RSV plus assay is intended as an aid in the diagnosis of influenza from Nasopharyngeal swab specimens and should not be used as a sole basis for treatment. Nasal washings and aspirates are unacceptable for Xpert Xpress SARS-CoV-2/FLU/RSV testing.  Fact Sheet for Patients: BloggerCourse.com  Fact Sheet for Healthcare Providers: SeriousBroker[employer]it  This test is not yet approved or cleared by the [place] FDA and has been authorized for detection and/or diagnosis of SARS-CoV-2 by FDA under an Emergency Use Authorization (EUA). This EUA will remain in effect (meaning this test can be used) for the duration of the COVID-19 declaration under Section 564(b)(1) of the Act, 21  U.S.C. section 360bbb-3(b)(1), unless the authorization is terminated or revoked.  Performed at [facility], [address] [place], [place] [address]      Labs: BNP (last 3 results) No results for input(s): BNP in the last 8760 hours. Basic Metabolic Panel: Recent Labs  Lab 11/15/20 1008 11/17/20 0456  NA 137 137  K 4.6 3.6  CL 104 107  CO2 25 22  GLUCOSE 92 143*  BUN 20 19  CREATININE 0.84 0.89  CALCIUM 8.9 8.6*   Liver Function Tests: Recent Labs  Lab 11/17/20 0456  AST 23  ALT 16  ALKPHOS 57  BILITOT 0.6  PROT 6.0*  ALBUMIN 3.2*   No results for input(s): LIPASE, AMYLASE in the last 168 hours. No results for input(s): AMMONIA in the last 168 hours. CBC: Recent Labs  Lab 11/15/20 1008 11/17/20 0456  WBC 4.5 8.0  NEUTROABS 3.1  --   HGB 11.5* 10.1*  HCT 35.3* 31.0*  MCV 93.4 90.6  PLT 275 294   Cardiac Enzymes: No results for input(s): CKTOTAL, CKMB, CKMBINDEX, TROPONINI in the last 168 hours. BNP: Invalid input(s): POCBNP CBG: No results for input(s): GLUCAP in the last 168 hours. D-Dimer No results for input(s): DDIMER in the last 72 hours. Hgb A1c No results for input(s): HGBA1C in the last 72 hours. Lipid Profile No results for input(s): CHOL, HDL, LDLCALC, TRIG, CHOLHDL, LDLDIRECT in the last 72 hours. Thyroid function studies No results for input(s): TSH, T4TOTAL, T3FREE, THYROIDAB in the last 72 hours.  Invalid input(s): FREET3 Anemia work up Recent Labs    11/16/20 0505  VITAMINB12 191  FOLATE 14.2  FERRITIN 13  TIBC 319  IRON 35  RETICCTPCT 2.1   Urinalysis No results found for: COLORURINE, APPEARANCEUR, LABSPEC, PHURINE, GLUCOSEU, HGBUR, BILIRUBINUR, KETONESUR, PROTEINUR, UROBILINOGEN, NITRITE, LEUKOCYTESUR Sepsis Labs Invalid input(s): PROCALCITONIN,  WBC,  LACTICIDVEN Microbiology Recent Results (from the past 240 hour(s))  Resp Panel by RT-PCR (Flu A&B, Covid) Nasopharyngeal Swab     Status: None    Collection Time: 11/15/20 10:08 AM   Specimen: Nasopharyngeal Swab; Nasopharyngeal(NP) swabs in vial transport medium  Result Value Ref Range Status   SARS Coronavirus 2 by RT PCR NEGATIVE NEGATIVE Final    Comment: (NOTE) SARS-CoV-2 target nucleic acids are NOT DETECTED.  The SARS-CoV-2 RNA is generally detectable in upper respiratory specimens during the acute phase of infection. The lowest concentration of SARS-CoV-2 viral copies this assay can detect is 138 copies/mL. A negative result does not preclude SARS-Cov-2 infection and should not be used as the  sole basis for treatment or other patient management decisions. A negative result may occur with  improper specimen collection/handling, submission of specimen other than nasopharyngeal swab, presence of viral mutation(s) within the areas targeted by this assay, and inadequate number of viral copies(<138 copies/mL). A negative result must be combined with clinical observations, patient history, and epidemiological information. The expected result is Negative.  Fact Sheet for Patients:  BloggerCourse.com  Fact Sheet for Healthcare Providers:  [employer]ker[employer]it  This test is no t yet approved or cleared by the [place] FDA and  has been authorized for detection and/or diagnosis of SARS-CoV-2 by FDA under an Emergency Use Authorization (EUA). This EUA will remain  in effect (meaning this test can be used) for the duration of the COVID-19 declaration under Section 564(b)(1) of the Act, 21 U.S.C.section 360bbb-3(b)(1), unless the authorization is terminated  or revoked sooner.       Influenza A by PCR NEGATIVE NEGATIVE Final   Influenza B by PCR NEGATIVE NEGATIVE Final    Comment: (NOTE) The Xpert Xpress SARS-CoV-2/FLU/RSV plus assay is intended as an aid in the diagnosis of influenza from Nasopharyngeal swab specimens and should not be used as a sole basis for treatment.  Nasal washings and aspirates are unacceptable for Xpert Xpress SARS-CoV-2/FLU/RSV testing.  Fact Sheet for Patients: BloggerCourse.com  Fact Sheet for Healthcare Providers: [employer]ious[employer]ker.it  This test is not yet approved or cleared by the [place] FDA and has been authorized for detection and/or diagnosis of SARS-CoV-2 by FDA under an Emergency Use Authorization (EUA). This EUA will remain in effect (meaning this test can be used) for the duration of the COVID-19 declaration under Section 564(b)(1) of the Act, 21 U.S.C. section 360bbb-3(b)(1), unless the authorization is terminated or revoked.  Performed at [facility], [address] [place], [place] [address]    Time spent: 30 min  SIGNED:   Rickey Barbara, MD  [facility] 11/18/2020, 12:07 PM  If 7PM-7AM, please contact night-coverage

## 2020-11-18 NOTE — Progress Notes (Signed)
Physical Therapy Treatment Patient Details Name: Sandy Summers MRN: [id number] DOB: Sandy Summers, Sandy Summers Today's Date: 11/18/2020   History of Present Illness Patient is 80 y.o. female s/p Lt hip pinning on 11/16/20 due to femoral neck fracture after a fall. PMH significant for HTN, thyroid disease.    PT Comments    Pt progressing well with acute therapy and continues to manage walker safely for transfers and gait. Gait quality improves as pt continues walking. EOS reviewed HEP for strengthening and encouraged to go to OPPT once discharged. Will continue to progress pt as able throughout stay.    Recommendations for follow up therapy are one component of a multi-disciplinary discharge planning process, led by the attending physician.  Recommendations may be updated based on patient status, additional functional criteria and insurance authorization.  Follow Up Recommendations  Outpatient PT     Assistance Recommended at Discharge Intermittent Supervision/Assistance  Equipment Recommendations  Rolling walker (2 wheels) (youth walker)    Recommendations for Other Services       Precautions / Restrictions Precautions Precautions: Fall Restrictions Weight Bearing Restrictions: No Other Position/Activity Restrictions: WBAT     Mobility  Bed Mobility Overal bed mobility: Needs Assistance Bed Mobility: Supine to Sit     Supine to sit: Supervision;HOB elevated     General bed mobility comments: no assist or cues needed, pt taking extra time to sit up EOB    Transfers Overall transfer level: Needs assistance Equipment used: Rolling walker (2 wheels);None Transfers: Sit to/from Stand Sit to Stand: Min guard;Supervision           General transfer comment: cues for hand placment. pt rose from EOB with no device to don pants fully; no LOB on rise but pt leaning on bed for support. supervision for stand with RW from EOB and recliner.    Ambulation/Gait Ambulation/Gait  assistance: Min guard Gait Distance (Feet): 160 Feet Assistive device: Rolling walker (2 wheels);1 person hand held assist Gait Pattern/deviations: Step-through pattern;Decreased stride length;Decreased weight shift to right;Antalgic Gait velocity: decr     General Gait Details: pt with reduced stance time and step length on Lt LE initially and improved thorughout gait. no overt LOB noted. guarding for safety.   Stairs             Wheelchair Mobility    Modified Rankin (Stroke Patients Only)       Balance Overall balance assessment: Mild deficits observed, not formally tested;History of Falls;Needs assistance Sitting-balance support: Feet supported Sitting balance-Leahy Scale: Good     Standing balance support: Single extremity supported;During functional activity;Reliant on assistive device for balance;Bilateral upper extremity supported Standing balance-Leahy Scale: Fair                              Cognition Arousal/Alertness: Awake/alert Behavior During Therapy: WFL for tasks assessed/performed Overall Cognitive Status: Within Functional Limits for tasks assessed                                          Exercises Other Exercises Other Exercises: Supine TE: 10x bridge; 10x SLR bil LE Other Exercises: Standing TE: 10x bil LE marching, 10x hip abduction on Lt LE, 15x bil heel raises    General Comments        Pertinent Vitals/Pain Pain Assessment: 0-10 Faces Pain Scale: Hurts a little bit Pain  Location: Lt hip Pain Descriptors / Indicators: Aching;Discomfort Pain Intervention(s): Limited activity within patient's tolerance;Monitored during session;Repositioned    Home Living                          Prior Function            PT Goals (current goals can now be found in the care plan section) Acute Rehab PT Goals Patient Stated Goal: get back to golf, walking 4 miles/day, and walking her dogs PT Goal  Formulation: With patient Time For Goal Achievement: 11/24/20 Potential to Achieve Goals: Good Progress towards PT goals: Progressing toward goals    Frequency    Min 3X/week      PT Plan Current plan remains appropriate    Co-evaluation              AM-PAC PT "6 Clicks" Mobility   Outcome Measure  Help needed turning from your back to your side while in a flat bed without using bedrails?: None Help needed moving from lying on your back to sitting on the side of a flat bed without using bedrails?: None Help needed moving to and from a bed to a chair (including a wheelchair)?: A Little Help needed standing up from a chair using your arms (e.g., wheelchair or bedside chair)?: A Little Help needed to walk in hospital room?: A Little Help needed climbing 3-5 steps with a railing? : A Little 6 Click Score: 20    End of Session Equipment Utilized During Treatment: Gait belt Activity Tolerance: Patient tolerated treatment well Patient left: in chair;with call bell/phone within reach;with family/visitor present Nurse Communication: Mobility status PT Visit Diagnosis: Other abnormalities of gait and mobility (R26.89);Difficulty in walking, not elsewhere classified (R26.2);History of falling (Z91.81)     Time: 1093-2355 PT Time Calculation (min) (ACUTE ONLY): 23 min  Charges:  $Gait Training: 8-22 mins $Therapeutic Exercise: 8-22 mins                     Wynn Maudlin, DPT Acute Rehabilitation Services Office [phone] Pager [phone]    Sandy Summers 11/18/2020, 1:16 PM

## 2020-11-29 ENCOUNTER — Encounter (HOSPITAL_COMMUNITY): Payer: Self-pay | Admitting: Orthopedic Surgery

## 2020-12-22 ENCOUNTER — Ambulatory Visit (INDEPENDENT_AMBULATORY_CARE_PROVIDER_SITE_OTHER): Payer: Medicare Other | Admitting: Medical

## 2020-12-22 VITALS — BP 129/86 | HR 83 | Temp 98.2°F | Resp 18 | Ht 59.0 in | Wt 105.0 lb

## 2020-12-22 DIAGNOSIS — I2510 Atherosclerotic heart disease of native coronary artery without angina pectoris: Secondary | ICD-10-CM | POA: Diagnosis not present

## 2020-12-22 DIAGNOSIS — D649 Anemia, unspecified: Secondary | ICD-10-CM

## 2020-12-22 DIAGNOSIS — L299 Pruritus, unspecified: Secondary | ICD-10-CM

## 2020-12-22 DIAGNOSIS — I10 Essential (primary) hypertension: Secondary | ICD-10-CM

## 2020-12-22 DIAGNOSIS — E785 Hyperlipidemia, unspecified: Secondary | ICD-10-CM

## 2020-12-22 DIAGNOSIS — S72002A Fracture of unspecified part of neck of left femur, initial encounter for closed fracture: Secondary | ICD-10-CM

## 2020-12-22 DIAGNOSIS — E039 Hypothyroidism, unspecified: Secondary | ICD-10-CM | POA: Diagnosis not present

## 2020-12-22 DIAGNOSIS — F419 Anxiety disorder, unspecified: Secondary | ICD-10-CM

## 2020-12-22 DIAGNOSIS — G4700 Insomnia, unspecified: Secondary | ICD-10-CM

## 2020-12-22 LAB — COMPREHENSIVE METABOLIC PANEL
ALT: 12 U/L (ref 0–35)
AST: 15 U/L (ref 0–37)
Albumin: 3.8 g/dL (ref 3.5–5.2)
Alkaline Phosphatase: 82 U/L (ref 39–117)
BUN: 18 mg/dL (ref 6–23)
CO2: 28 mEq/L (ref 19–32)
Calcium: 9.1 mg/dL (ref 8.4–10.5)
Chloride: 104 mEq/L (ref 96–112)
Creatinine, Ser: 0.81 mg/dL (ref 0.40–1.20)
GFR: 68.8 mL/min (ref >60.00)
Glucose, Bld: 91 mg/dL (ref 70–99)
Potassium: 4 mEq/L (ref 3.5–5.1)
Sodium: 139 mEq/L (ref 135–145)
Total Bilirubin: 0.4 mg/dL (ref 0.2–1.2)
Total Protein: 6.4 g/dL (ref 6.0–8.3)

## 2020-12-22 LAB — T4, FREE: Free T4: 0.74 ng/dL (ref 0.60–1.60)

## 2020-12-22 LAB — CBC WITH DIFFERENTIAL/PLATELET
Basophils Absolute: 0 10*3/uL (ref 0.0–0.1)
Basophils Relative: 0.4 % (ref 0.0–3.0)
Eosinophils Absolute: 0.4 10*3/uL (ref 0.0–0.7)
Eosinophils Relative: 8.5 % — ABNORMAL HIGH (ref 0.0–5.0)
HCT: 32.7 % — ABNORMAL LOW (ref 36.0–46.0)
Hemoglobin: 10.6 g/dL — ABNORMAL LOW (ref 12.0–15.0)
Lymphocytes Relative: 13.4 % (ref 12.0–46.0)
Lymphs Abs: 0.6 10*3/uL — ABNORMAL LOW (ref 0.7–4.0)
MCHC: 32.3 g/dL (ref 30.0–36.0)
MCV: 89.2 fl (ref 78.0–100.0)
Monocytes Absolute: 0.5 10*3/uL (ref 0.1–1.0)
Monocytes Relative: 10.8 % (ref 3.0–12.0)
Neutro Abs: 3 10*3/uL (ref 1.4–7.7)
Neutrophils Relative %: 66.9 % (ref 43.0–77.0)
Platelets: 284 10*3/uL (ref 150.0–400.0)
RBC: 3.67 Mil/uL — ABNORMAL LOW (ref 3.87–5.11)
RDW: 15.5 % (ref 11.5–15.5)
WBC: 4.4 10*3/uL (ref 4.0–10.5)

## 2020-12-22 LAB — TSH: TSH: 4.36 u[IU]/mL (ref 0.35–5.50)

## 2020-12-22 LAB — AMMONIA: Ammonia: 24 umol/L (ref 11–35)

## 2020-12-22 LAB — IRON: Iron: 52 ug/dL (ref 42–145)

## 2020-12-22 MED ORDER — LORAZEPAM 1 MG PO TABS: 0.5000 mg | ORAL_TABLET | Freq: Every day | ORAL | 0 refills | Status: DC

## 2020-12-22 MED ORDER — TRAZODONE HCL 50 MG PO TABS: 25.0000 mg | ORAL_TABLET | Freq: Every evening | ORAL | 3 refills | Status: DC | PRN

## 2020-12-22 NOTE — Progress Notes (Signed)
Subjective:    Patient ID: Sandy Summers, female    DOB: 03-24-1940, 80 y.o.   MRN: [id number]  HPI  Pt in for first time. Pt states her pcp left practice only saw that provider once.    Pt states she has itching all over for about one month. Pt does have rash on her left forearm. Pt states itching occurred around time she got hip surgery. Pt mentions that she does have dermatologist.  Htn- pt is on lisinopril 5 mg daily and metoprolol xl 25 mg daily.   High cholesterol- Pt on atorvastatin.  Hypothyroid- on levothyroxine 50 mcg daily.  Anxiety- Pt has been on meds for years. Also uses for sleep. She takes it every night for past 3 years. She states has tried to go without and states "impossible".   With her insomnia states never tried trazadone.  Pt had flu this year.  She had her last covid vaccine last July. No prior history of any side effects to vaccine.     Review of Systems  Constitutional:  Negative for chills, fatigue and fever.  HENT:  Negative for congestion.   Respiratory:  Negative for cough, chest tightness, shortness of breath and wheezing.   Cardiovascular:  Negative for chest pain and palpitations.  Gastrointestinal:  Negative for abdominal distention, abdominal pain, constipation, diarrhea, nausea and vomiting.  Musculoskeletal:  Negative for back pain, joint swelling and neck pain.  Skin:  Positive for rash.       Itching.  Neurological:  Negative for dizziness, seizures, numbness and headaches.  Hematological:  Negative for adenopathy. Does not bruise/bleed easily.  Psychiatric/Behavioral:  Negative for behavioral problems, dysphoric mood, hallucinations and sleep disturbance. The patient is not nervous/anxious.    Past Medical History:  Diagnosis Date   Hypertension    Thyroid disease      Social History   Socioeconomic History   Marital status: Married    Spouse name: Not on file   Number of children: Not on file   Years of education: Not  on file   Highest education level: Not on file  Occupational History   Not on file  Tobacco Use   Smoking status: Former    Types: Cigarettes    Quit date: 10/06/1995    Years since quitting: 25.2   Smokeless tobacco: Never  Substance and Sexual Activity   Alcohol use: No   Drug use: No   Sexual activity: Not Currently  Other Topics Concern   Not on file  Social History Narrative   Not on file   Social Determinants of Health   Financial Resource Strain: Not on file  Food Insecurity: Not on file  Transportation Needs: Not on file  Physical Activity: Not on file  Stress: Not on file  Social Connections: Not on file  Intimate Partner Violence: Not on file    Past Surgical History:  Procedure Laterality Date   CORONARY ANGIOPLASTY WITH STENT PLACEMENT  in 2002   HIP PINNING,CANNULATED Left 11/16/2020   Procedure: CANNULATED HIP PINNING;  Surgeon: Ernestina Columbia, MD;  Location: [facility];  Service: Orthopedics;  Laterality: Left;  synthes, cannulated screws, hanna table, large c-arm,    can go as early as 2 p.m.    No family history on file.  Allergies  Allergen Reactions   Alendronate Nausea Only   Amoxicillin-Pot Clavulanate Diarrhea   Denosumab Other (See Comments)    I just didn't like it    Gabapentin Other (See Comments)  Ineffective for sleep "made me crazy"    Current Outpatient Medications on File Prior to Visit  Medication Sig Dispense Refill   aspirin 81 MG chewable tablet Chew 81 mg by mouth daily.       atorvastatin (LIPITOR) 20 MG tablet Take 20 mg by mouth daily.     Cholecalciferol 25 MCG (1000 UT) tablet Take 1,000 Units by mouth daily.     enoxaparin (LOVENOX) 40 MG/0.4ML injection Inject 0.4 mLs (40 mg total) into the skin daily. 15.6 mL 0   HYDROcodone-acetaminophen (NORCO/VICODIN) 5-325 MG tablet Take 1-2 tablets by mouth every 6 (six) hours as needed for moderate pain. 30 tablet 0   levothyroxine (SYNTHROID, LEVOTHROID) 50 MCG tablet Take 50 mcg  by mouth daily.       lisinopril (ZESTRIL) 5 MG tablet Take 5 mg by mouth daily.     LORazepam (ATIVAN) 1 MG tablet Take 0.5 mg by mouth at bedtime as needed for anxiety. For sleep     metoprolol succinate (TOPROL-XL) 50 MG 24 hr tablet Take 50 mg by mouth at bedtime.     Multiple Vitamins-Minerals (PRESERVISION AREDS 2 PO) Take 1 capsule by mouth daily.     senna-docusate (SENOKOT-S) 8.6-50 MG tablet Take 1 tablet by mouth at bedtime as needed for mild constipation. 30 tablet 0   No current facility-administered medications on file prior to visit.    BP 129/86    Pulse 83    Temp 98.2 F (36.8 C)    Resp 18    Ht 4\' 11"  (1.499 m)    Wt 105 lb (47.6 kg)    SpO2 96%    BMI 21.21 kg/m       Objective:   Physical Exam  General Mental Status- Alert. General Appearance- Not in acute distress.   Skin General: Color- Normal Color. Moisture- Normal Moisture.  Neck Carotid Arteries- Normal color. Moisture- Normal Moisture. No carotid bruits. No JVD.  Chest and Lung Exam Auscultation: Breath Sounds:-Normal.  Cardiovascular Auscultation:Rythm- Regular. Murmurs & Other Heart Sounds:Auscultation of the heart reveals- No Murmurs.  Abdomen Inspection:-Inspeection Normal. Palpation/Percussion:Note:No mass. Palpation and Percussion of the abdomen reveal- Non Tender, Non Distended + BS, no rebound or guarding.   Neurologic Cranial Nerve exam:- CN III-XII intact(No nystagmus), symmetric smile. Strength:- 5/5 equal and symmetric strength both upper and lower extremities.   Skin- rt forearm has slight rash with dry feel.     Assessment & Plan:   Patient Instructions  Htn- your bp is well controlled. Continue current bp meds lisinopril 5 mg daily and metoprolol xl 25 mg daily.   Hyperlipidemia- continue atorvastatin.  Hypothyroid- continue levothryoxine 50 mcg daily.  For anxiety and insomnia nightly I want you to try trazadone and not ativan. Will make 15 tab rx ativan available to  use if needed. But ideally if trazadone works then would not need ativan. Give me update next week on how trazadone worked?  CAD. Continue statin and daily exercise.  Consider getting covid booster. You report pneumonia vaccine 3 years ago or so. I don't see which one you received. May give pneumonia vaccine on next visit. If you would call prior pcp office and see if they have which pneumonia vaccine you received in past.   Will get cbc, cmp, lipid panel, tsh and t4 today.  I do think your skin appears dry on left forearm. I would try aveeno or lubridem. If this does not work then recommend seeing dermatologist. Pt also tried  and states not working.   Follow up date to be determined after lab review.         Esperanza Richters, PA-C

## 2020-12-22 NOTE — Patient Instructions (Addendum)
Htn- your bp is well controlled. Continue current bp meds lisinopril 5 mg daily and metoprolol xl 25 mg daily.   Hyperlipidemia- continue atorvastatin.  Hypothyroid- continue levothryoxine 50 mcg daily.  For anxiety and insomnia nightly I want you to try trazadone and not ativan. Will make 15 tab rx ativan available to use if needed. But ideally if trazadone works then would not need ativan. Give me update next week on how trazadone worked?  CAD. Continue statin and daily exercise.  Consider getting covid booster. You report pneumonia vaccine 3 years ago or so. I don't see which one you received. May give pneumonia vaccine on next visit. If you would call prior pcp office and see if they have which pneumonia vaccine you received in past.   Will get cbc, cmp, lipid panel, tsh and t4 today.  I do think your skin appears dry on left forearm. I would try aveeno or lubridem. If this does not work then recommend seeing dermatologist. Pt also tried [employer] adviser and states not working.   Follow up date to be determined after lab review.

## 2021-01-05 ENCOUNTER — Telehealth: Payer: Self-pay | Admitting: Medical

## 2021-01-05 MED ORDER — ATORVASTATIN CALCIUM 20 MG PO TABS: 20.0000 mg | ORAL_TABLET | Freq: Every day | ORAL | 3 refills | Status: DC

## 2021-01-05 NOTE — Telephone Encounter (Signed)
Rx sent 

## 2021-01-05 NOTE — Telephone Encounter (Signed)
Medication: atorvastatin (LIPITOR) 20 MG tablet  Has the patient contacted their pharmacy? Yes.   (If no, request that the patient contact the pharmacy for the refill.) (If yes, when and what did the pharmacy advise?)  Preferred Pharmacy (with phone number or street name):  [facility] [address] [place], [place] - [address]  [address], [place] [address]  Phone:  [phone]  Fax:  [phone]   Agent: Please be advised that RX refills may take up to 3 business days. We ask that you follow-up with your pharmacy.

## 2021-01-23 ENCOUNTER — Encounter: Payer: Self-pay | Admitting: Medical

## 2021-01-23 ENCOUNTER — Ambulatory Visit (INDEPENDENT_AMBULATORY_CARE_PROVIDER_SITE_OTHER): Payer: Medicare Other | Admitting: Medical

## 2021-01-23 VITALS — BP 150/85 | HR 78 | Resp 18 | Ht 59.0 in | Wt 109.0 lb

## 2021-01-23 DIAGNOSIS — E785 Hyperlipidemia, unspecified: Secondary | ICD-10-CM

## 2021-01-23 DIAGNOSIS — F419 Anxiety disorder, unspecified: Secondary | ICD-10-CM

## 2021-01-23 DIAGNOSIS — D649 Anemia, unspecified: Secondary | ICD-10-CM

## 2021-01-23 DIAGNOSIS — L299 Pruritus, unspecified: Secondary | ICD-10-CM

## 2021-01-23 DIAGNOSIS — E039 Hypothyroidism, unspecified: Secondary | ICD-10-CM | POA: Diagnosis not present

## 2021-01-23 DIAGNOSIS — I10 Essential (primary) hypertension: Secondary | ICD-10-CM | POA: Diagnosis not present

## 2021-01-23 LAB — CBC WITH DIFFERENTIAL/PLATELET
Basophils Absolute: 0 10*3/uL (ref 0.0–0.1)
Basophils Relative: 0.6 % (ref 0.0–3.0)
Eosinophils Absolute: 0.3 10*3/uL (ref 0.0–0.7)
Eosinophils Relative: 6.6 % — ABNORMAL HIGH (ref 0.0–5.0)
HCT: 35.6 % — ABNORMAL LOW (ref 36.0–46.0)
Hemoglobin: 11.4 g/dL — ABNORMAL LOW (ref 12.0–15.0)
Lymphocytes Relative: 17.1 % (ref 12.0–46.0)
Lymphs Abs: 0.8 10*3/uL (ref 0.7–4.0)
MCHC: 32 g/dL (ref 30.0–36.0)
MCV: 88.8 fl (ref 78.0–100.0)
Monocytes Absolute: 0.4 10*3/uL (ref 0.1–1.0)
Monocytes Relative: 9.3 % (ref 3.0–12.0)
Neutro Abs: 3.1 10*3/uL (ref 1.4–7.7)
Neutrophils Relative %: 66.4 % (ref 43.0–77.0)
Platelets: 236 10*3/uL (ref 150.0–400.0)
RBC: 4.01 Mil/uL (ref 3.87–5.11)
RDW: 17.1 % — ABNORMAL HIGH (ref 11.5–15.5)
WBC: 4.7 10*3/uL (ref 4.0–10.5)

## 2021-01-23 LAB — IRON: Iron: 56 ug/dL (ref 42–145)

## 2021-01-23 NOTE — Progress Notes (Signed)
Subjective:    Patient ID: Sandy Summers, female    DOB: [birth year], 81 y.o.   MRN: [id number]  HPI  Pt in for evaluation.  Pt states she has some itching around christmas. She went to dermatologist and it helps her sleep some but she still itches.    Pt states dermatologist told her she had dermatitis and they rx hydroxyzine. She is moisturizing herself, using dove soap and clear free detergent. Pt is using 2 tablet of hyroxzine 10 mg at night.  Pt also is using trazadone 1/2-1 tab trazadone. She tried 1/2 tab at night and helps her sleep.   Hx of anxiety-Pt has been on meds for years. Also uses for sleep. She takes it every night for past 3 years. She states has tried to go without and states "impossible". I gave her ativan low number and she is using them very sparingly. In the past she states she was using 1/2 tab every 3rd night.  Htn- pt is on lisinopril 5 mg daily and metoprolol xl 25 mg daily.    High cholesterol- Pt on atorvastatin.   Hypothyroid- on levothyroxine 50 mcg daily.     Review of Systems  Constitutional:  Negative for chills and fatigue.  HENT:  Negative for dental problem.   Respiratory:  Negative for cough, chest tightness, shortness of breath and wheezing.   Cardiovascular:  Negative for chest pain and palpitations.  Gastrointestinal:  Negative for abdominal distention, abdominal pain, blood in stool and constipation.  Genitourinary:  Negative for difficulty urinating and dysuria.  Musculoskeletal:  Negative for back pain, joint swelling and neck pain.  Skin:  Negative for rash.       Itching.  Neurological:  Negative for dizziness, speech difficulty, numbness and headaches.  Hematological:  Negative for adenopathy. Does not bruise/bleed easily.  Psychiatric/Behavioral:  Positive for sleep disturbance. Negative for behavioral problems, decreased concentration and dysphoric mood.     Past Medical History:  Diagnosis Date   Hypertension     Thyroid disease      Social History   Socioeconomic History   Marital status: Married    Spouse name: Not on file   Number of children: Not on file   Years of education: Not on file   Highest education level: Not on file  Occupational History   Not on file  Tobacco Use   Smoking status: Former    Types: Cigarettes    Quit date: 10/06/1995    Years since quitting: 25.3   Smokeless tobacco: Never  Substance and Sexual Activity   Alcohol use: No   Drug use: No   Sexual activity: Not Currently  Other Topics Concern   Not on file  Social History Narrative   Not on file   Social Determinants of Health   Financial Resource Strain: Not on file  Food Insecurity: Not on file  Transportation Needs: Not on file  Physical Activity: Not on file  Stress: Not on file  Social Connections: Not on file  Intimate Partner Violence: Not on file    Past Surgical History:  Procedure Laterality Date   CORONARY ANGIOPLASTY WITH STENT PLACEMENT  in 2002   HIP PINNING,CANNULATED Left 11/16/2020   Procedure: CANNULATED HIP PINNING;  Surgeon: Ernestina Columbia, MD;  Location: [facility];  Service: Orthopedics;  Laterality: Left;  synthes, cannulated screws, hanna table, large c-arm,    can go as early as 2 p.m.    No family history on file.  Allergies  Allergen Reactions   Alendronate Nausea Only   Amoxicillin-Pot Clavulanate Diarrhea   Denosumab Other (See Comments)    I just didn't like it    Gabapentin Other (See Comments)    Ineffective for sleep "made me crazy"    Current Outpatient Medications on File Prior to Visit  Medication Sig Dispense Refill   aspirin 81 MG chewable tablet Chew 81 mg by mouth daily.       atorvastatin (LIPITOR) 20 MG tablet Take 1 tablet (20 mg total) by mouth daily. 30 tablet 3   Cholecalciferol 25 MCG (1000 UT) tablet Take 1,000 Units by mouth daily.     HYDROcodone-acetaminophen (NORCO/VICODIN) 5-325 MG tablet Take 1-2 tablets by mouth every 6 (six) hours as  needed for moderate pain. 30 tablet 0   levothyroxine (SYNTHROID, LEVOTHROID) 50 MCG tablet Take 50 mcg by mouth daily.       lisinopril (ZESTRIL) 5 MG tablet Take 5 mg by mouth daily.     LORazepam (ATIVAN) 1 MG tablet Take 0.5 tablets (0.5 mg total) by mouth at bedtime. For sleep 15 tablet 0   metoprolol succinate (TOPROL-XL) 50 MG 24 hr tablet Take 50 mg by mouth at bedtime.     Multiple Vitamins-Minerals (PRESERVISION AREDS 2 PO) Take 1 capsule by mouth daily.     senna-docusate (SENOKOT-S) 8.6-50 MG tablet Take 1 tablet by mouth at bedtime as needed for mild constipation. 30 tablet 0   traZODone (DESYREL) 50 MG tablet Take 0.5-1 tablets (25-50 mg total) by mouth at bedtime as needed for sleep. 30 tablet 3   enoxaparin (LOVENOX) 40 MG/0.4ML injection Inject 0.4 mLs (40 mg total) into the skin daily. 15.6 mL 0   No current facility-administered medications on file prior to visit.    BP (!) 154/79    Pulse 78    Resp 18    Ht 4\' 11"  (1.499 m)    Wt 109 lb (49.4 kg)    SpO2 99%    BMI 22.02 kg/m        Objective:   Physical Exam  General- No acute distress. Pleasant patient. Neck- Full range of motion, no jvd Lungs- Clear, even and unlabored. Heart- regular rate and rhythm. Neurologic- CNII- XII grossly intact.       Assessment & Plan:   Patient Instructions  Itching skin, insomnia and anxiety.  Recent visit to dermatologist for the itching.  Per your description sounds like they advised treating for dry skin and avoiding potential allergens.  Continue those conservative measures.  Only getting 4 hours of sleep with 20 mg hydroxyzine.  Presently advising stop hydroxyzine.  Presently on considering that anxiety might be primary problem so you can use Ativan 0.5 mg 1 tablet at night for the next 3 nights.  Hold trazodone presently.  Want to see how you sleep with Ativan alone.  Trazodone could be added on at half tab dose in the future if needed.  Give me an update in about 4 days  how he did with Ativan alone.  .  Hypertension-blood pressure mild elevated today.  Continue current BP medications.  I do want you to check your blood pressure once daily and call with blood pressure update in 4 days as well.  Hyperlipidemia-continue current medications for high cholesterol.  Anemia found on last labs.  Rechecking CBC with iron level today.  Follow-up in 2 weeks or sooner if needed.   , PA-C

## 2021-01-23 NOTE — Patient Instructions (Addendum)
Itching skin, insomnia and anxiety.  Recent visit to dermatologist for the itching.  Per your description sounds like they advised treating for dry skin and avoiding potential allergens.  Continue those conservative measures.  Only getting 4 hours of sleep with 20 mg hydroxyzine.  Presently advising stop hydroxyzine.  Presently on considering that anxiety might be primary problem so you can use Ativan 0.5 mg 1 tablet at night for the next 3 nights.  Hold trazodone presently.  Want to see how you sleep with Ativan alone.  Trazodone could be added on at half tab dose in the future if needed.  Give me an update in about 4 days how he did with Ativan alone.  .  Hypertension-blood pressure mild elevated today.  Continue current BP medications.  I do want you to check your blood pressure once daily and call with blood pressure update in 4 days as well.  Hyperlipidemia-continue current medications for high cholesterol.  Anemia found on last labs.  Rechecking CBC with iron level today.  Follow-up in 2 weeks or sooner if needed.

## 2021-02-13 ENCOUNTER — Telehealth: Payer: Self-pay | Admitting: Medical

## 2021-02-13 MED ORDER — LISINOPRIL 5 MG PO TABS: 5.0000 mg | ORAL_TABLET | Freq: Every day | ORAL | 1 refills | Status: DC

## 2021-02-13 NOTE — Telephone Encounter (Signed)
Rx lisinopril sent to pharmacy.  Esperanza Richters, PA-C

## 2021-02-13 NOTE — Telephone Encounter (Signed)
Patient did do a BP check after her visit , cant remember dates but here are the readings  144/74  121/63 - Tuesday 150/100    112/83- weds 124/83 126/78 - Thursday 145/88   126/68 -- Friday  110/73   108/73 -- Saturday    Pt states she has been taking her BP medicine =-- no issues , and she stated she doing okay with the ativan for sleep , only takes 1/2 pill .Marland Kitchen

## 2021-02-13 NOTE — Telephone Encounter (Signed)
Pt states she does not understand why she was taken off bp medication. She did not want to schedule an appointment to discuss this. She would like a nurse to call her and explain. Please advise.

## 2021-02-13 NOTE — Telephone Encounter (Signed)
Pt stated her itching under control and is she is taking trazadone  but she needs a refill on lisinopril

## 2021-02-14 NOTE — Telephone Encounter (Signed)
Stated yesterday she prefers the trazodone

## 2021-02-15 DIAGNOSIS — M25552 Pain in left hip: Secondary | ICD-10-CM | POA: Diagnosis not present

## 2021-02-15 DIAGNOSIS — Z9889 Other specified postprocedural states: Secondary | ICD-10-CM | POA: Diagnosis not present

## 2021-03-01 ENCOUNTER — Telehealth: Payer: Self-pay | Admitting: Medical

## 2021-03-01 ENCOUNTER — Telehealth: Payer: Self-pay

## 2021-03-01 MED ORDER — METOPROLOL SUCCINATE ER 50 MG PO TB24: 50.0000 mg | ORAL_TABLET | Freq: Every day | ORAL | 0 refills | Status: DC

## 2021-03-01 NOTE — Telephone Encounter (Signed)
Appt scheduled for 03/02/21 ?

## 2021-03-01 NOTE — Telephone Encounter (Signed)
Patient came into office today requesting refill on Metoprolol , last prescribed by previous provider in 2021 ?

## 2021-03-01 NOTE — Telephone Encounter (Signed)
Chart opened to rx med, order lab, review chart, respond to my chart message or send message to staff member  

## 2021-03-02 ENCOUNTER — Encounter: Payer: Self-pay | Admitting: Medical

## 2021-03-02 ENCOUNTER — Ambulatory Visit (INDEPENDENT_AMBULATORY_CARE_PROVIDER_SITE_OTHER): Payer: Medicare Other | Admitting: Medical

## 2021-03-02 VITALS — BP 140/68 | HR 78 | Resp 18 | Ht 59.0 in | Wt 110.0 lb

## 2021-03-02 DIAGNOSIS — I10 Essential (primary) hypertension: Secondary | ICD-10-CM | POA: Diagnosis not present

## 2021-03-02 DIAGNOSIS — G4700 Insomnia, unspecified: Secondary | ICD-10-CM

## 2021-03-02 MED ORDER — METOPROLOL SUCCINATE ER 50 MG PO TB24: 50.0000 mg | ORAL_TABLET | Freq: Every day | ORAL | 3 refills | Status: DC

## 2021-03-02 NOTE — Progress Notes (Signed)
? ?Subjective:  ? ? Patient ID: Sandy Summers, female    DOB: [birth year], 81 y.o.   MRN: [id number] ? ?HPI ? ?Pt in with high bp. ? ?I just reflled her metoprolol as she asked for request. She still has tab/did not run out. ? ?Pt is on lisinopril  5 mg as well.  ? ?Pt does not check bp regularly. Only checks if she does not feel good. ? ?Cmp 2 months ago showed good kidney function. ? ?Pt has insomnia. Pt using trazadone 1/2 tab at night. Does help her sleep. She is not using ativan.  ? ? ? ?Review of Systems  ?Constitutional:  Negative for chills, fatigue and fever.  ?Respiratory:  Negative for cough, chest tightness, shortness of breath and wheezing.   ?Cardiovascular:  Negative for chest pain and palpitations.  ?Gastrointestinal:  Negative for abdominal pain.  ?Musculoskeletal:  Negative for back pain.  ?Neurological:  Negative for dizziness, speech difficulty and light-headedness.  ?Hematological:  Negative for adenopathy. Does not bruise/bleed easily.  ?Psychiatric/Behavioral:  Positive for sleep disturbance. Negative for behavioral problems, decreased concentration and dysphoric mood.   ? ?Past Medical History:  ?Diagnosis Date  ? Hypertension   ? Thyroid disease   ? ?  ?Social History  ? ?Socioeconomic History  ? Marital status: Married  ?  Spouse name: Not on file  ? Number of children: Not on file  ? Years of education: Not on file  ? Highest education level: Not on file  ?Occupational History  ? Not on file  ?Tobacco Use  ? Smoking status: Former  ?  Types: Cigarettes  ?  Quit date: 10/06/1995  ?  Years since quitting: 25.4  ? Smokeless tobacco: Never  ?Substance and Sexual Activity  ? Alcohol use: No  ? Drug use: No  ? Sexual activity: Not Currently  ?Other Topics Concern  ? Not on file  ?Social History Narrative  ? Not on file  ? ?Social Determinants of Health  ? ?Financial Resource Strain: Not on file  ?Food Insecurity: Not on file  ?Transportation Needs: Not on file  ?Physical Activity: Not on file   ?Stress: Not on file  ?Social Connections: Not on file  ?Intimate Partner Violence: Not on file  ? ? ?Past Surgical History:  ?Procedure Laterality Date  ? CORONARY ANGIOPLASTY WITH STENT PLACEMENT  in 2002  ? HIP PINNING,CANNULATED Left 11/16/2020  ? Procedure: CANNULATED HIP PINNING;  Surgeon: Georgeanna Brittingham, MD;  Location: [facility];  Service: Orthopedics;  Laterality: Left;  synthes, cannulated screws, hanna table, large c-arm,    can go as early as 2 p.m.  ? ? ?No family history on file. ? ?Allergies  ?Allergen Reactions  ? Alendronate Nausea Only  ? Amoxicillin-Pot Clavulanate Diarrhea  ? Denosumab Other (See Comments)  ?  I just didn't like it   ? Gabapentin Other (See Comments)  ?  Ineffective for sleep ?"made me crazy"  ? ? ?Current Outpatient Medications on File Prior to Visit  ?Medication Sig Dispense Refill  ? aspirin 81 MG chewable tablet Chew 81 mg by mouth daily.      ? Cholecalciferol 25 MCG (1000 UT) tablet Take 1,000 Units by mouth daily.    ? HYDROcodone-acetaminophen (NORCO/VICODIN) 5-325 MG tablet Take 1-2 tablets by mouth every 6 (six) hours as needed for moderate pain. 30 tablet 0  ? levothyroxine (SYNTHROID, LEVOTHROID) 50 MCG tablet Take 50 mcg by mouth daily.      ? lisinopril (ZESTRIL) 5  MG tablet Take 1 tablet (5 mg total) by mouth daily. 90 tablet 1  ? LORazepam (ATIVAN) 1 MG tablet Take 0.5 tablets (0.5 mg total) by mouth at bedtime. For sleep 15 tablet 0  ? metoprolol succinate (TOPROL-XL) 50 MG 24 hr tablet Take 1 tablet (50 mg total) by mouth at bedtime. 30 tablet 0  ? Multiple Vitamins-Minerals (PRESERVISION AREDS 2 PO) Take 1 capsule by mouth daily.    ? senna-docusate (SENOKOT-S) 8.6-50 MG tablet Take 1 tablet by mouth at bedtime as needed for mild constipation. 30 tablet 0  ? traZODone (DESYREL) 50 MG tablet Take 0.5-1 tablets (25-50 mg total) by mouth at bedtime as needed for sleep. 30 tablet 3  ? atorvastatin (LIPITOR) 20 MG tablet Take 1 tablet (20 mg total) by mouth daily. 30  tablet 3  ? enoxaparin (LOVENOX) 40 MG/0.4ML injection Inject 0.4 mLs (40 mg total) into the skin daily. 15.6 mL 0  ? ?No current facility-administered medications on file prior to visit.  ? ? ?BP 140/68   Pulse 78   Resp 18   Ht 4\' 11"  (1.499 m)   Wt 110 lb (49.9 kg)   SpO2 100%   BMI 22.22 kg/m?  ?  ?   ?Objective:  ? Physical Exam ? ?General- No acute distress. Pleasant patient. ?Neck- Full range of motion, no jvd ?Lungs- Clear, even and unlabored. ?Heart- regular rate and rhythm. ?Neurologic- CNII- XII grossly intact.  ? ? ?   ?Assessment & Plan:  ? ?Patient Instructions  ?Htn- bp better today compared to last check. Do ask you check bp at home daily for next 3 days and call early next week with those readings. Want to confirm not higher and may be better than here. ? ?For insomnia continue with trazadone. Rx advisement given.  ? ?If bp well controlled can follow up in August or sooner if needed.  ? ?Mackie Pai, PA-C  ?

## 2021-03-02 NOTE — Patient Instructions (Addendum)
Htn- bp better today compared to last check. Do ask you check bp at home daily for next 3 days and call early next week with those readings. Want to confirm not higher and may be better than here. ? ?For insomnia continue with trazadone. Rx advisement given.  ? ?If bp well controlled can follow up in August or sooner if needed. ?

## 2021-03-06 ENCOUNTER — Telehealth: Payer: Self-pay | Admitting: Medical

## 2021-03-06 NOTE — Telephone Encounter (Signed)
Pt.notified

## 2021-03-06 NOTE — Telephone Encounter (Signed)
Pt changed her batteries in bp cuff and measured her bp after her morning walk for three days.  ? ?Bp Fri: 124/89 ?Sat:111/66 ?Sun: 117/59 ?

## 2021-04-20 ENCOUNTER — Ambulatory Visit (INDEPENDENT_AMBULATORY_CARE_PROVIDER_SITE_OTHER): Payer: Medicare Other

## 2021-04-20 VITALS — Ht 59.0 in | Wt 110.0 lb

## 2021-04-20 DIAGNOSIS — Z0000 Encounter for general adult medical examination without abnormal findings: Secondary | ICD-10-CM | POA: Diagnosis not present

## 2021-04-20 NOTE — Patient Instructions (Signed)
[name] , ?Thank you for taking time to come for your Medicare Wellness Visit. I appreciate your ongoing commitment to your health goals. Please review the following plan we discussed and let me know if I can assist you in the future.  ? ?Screening recommendations/referrals: ?Colonoscopy: No longer required due age.  ?Mammogram: Done at [facility] 2022, please ask for report to be sent to our office. ?Bone Density: Done at [facility] 2021. ?Recommended yearly ophthalmology/optometry visit for glaucoma screening and checkup ?Recommended yearly dental visit for hygiene and checkup ? ?Vaccinations: ?Influenza vaccine: Done 09/28/2020 Repeat annually ? ?Pneumococcal vaccine: Done 04/09/2013, 01/23/2006 ?Tdap vaccine: Done 07/15/2020 Repeat in 10 years ? ?Shingles vaccine: Declined.   ?Covid-19:Done 03/03/2019 and 02/06/2019 ? ?Advanced directives: Please bring a copy of your health care power of attorney and living will to the office to be added to your chart at your convenience. ? ? ?Conditions/risks identified: KEEP UP THE GOOD WORK!! ? ?Next appointment: Follow up in one year for your annual wellness visit 2024 ? ? ?Preventive Care 58 Years and Older, Female ?Preventive care refers to lifestyle choices and visits with your health care provider that can promote health and wellness. ?What does preventive care include? ?A yearly physical exam. This is also called an annual well check. ?Dental exams once or twice a year. ?Routine eye exams. Ask your health care provider how often you should have your eyes checked. ?Personal lifestyle choices, including: ?Daily care of your teeth and gums. ?Regular physical activity. ?Eating a healthy diet. ?Avoiding tobacco and drug use. ?Limiting alcohol use. ?Practicing safe sex. ?Taking low-dose aspirin every day. ?Taking vitamin and mineral supplements as recommended by your health care provider. ?What happens during an annual well check? ?The services and screenings done by your health care  provider during your annual well check will depend on your age, overall health, lifestyle risk factors, and family history of disease. ?Counseling  ?Your health care provider may ask you questions about your: ?Alcohol use. ?Tobacco use. ?Drug use. ?Emotional well-being. ?Home and relationship well-being. ?Sexual activity. ?Eating habits. ?History of falls. ?Memory and ability to understand (cognition). ?Work and work Astronomer. ?Reproductive health. ?Screening  ?You may have the following tests or measurements: ?Height, weight, and BMI. ?Blood pressure. ?Lipid and cholesterol levels. These may be checked every 5 years, or more frequently if you are over 62 years old. ?Skin check. ?Lung cancer screening. You may have this screening every year starting at age 82 if you have a 30-pack-year history of smoking and currently smoke or have quit within the past 15 years. ?Fecal occult blood test (FOBT) of the stool. You may have this test every year starting at age 25. ?Flexible sigmoidoscopy or colonoscopy. You may have a sigmoidoscopy every 5 years or a colonoscopy every 10 years starting at age 70. ?Hepatitis C blood test. ?Hepatitis B blood test. ?Sexually transmitted disease (STD) testing. ?Diabetes screening. This is done by checking your blood sugar (glucose) after you have not eaten for a while (fasting). You may have this done every 1-3 years. ?Bone density scan. This is done to screen for osteoporosis. You may have this done starting at age 71. ?Mammogram. This may be done every 1-2 years. Talk to your health care provider about how often you should have regular mammograms. ?Talk with your health care provider about your test results, treatment options, and if necessary, the need for more tests. ?Vaccines  ?Your health care provider may recommend certain vaccines, such as: ?Influenza vaccine.  This is recommended every year. ?Tetanus, diphtheria, and acellular pertussis (Tdap, Td) vaccine. You may need a Td  booster every 10 years. ?Zoster vaccine. You may need this after age 26. ?Pneumococcal 13-valent conjugate (PCV13) vaccine. One dose is recommended after age 1. ?Pneumococcal polysaccharide (PPSV23) vaccine. One dose is recommended after age 72. ?Talk to your health care provider about which screenings and vaccines you need and how often you need them. ?This information is not intended to replace advice given to you by your health care provider. Make sure you discuss any questions you have with your health care provider. ?Document Released: 01/14/2015 Document Revised: 09/07/2015 Document Reviewed: 10/19/2014 ?Elsevier Interactive Patient Education ? 2017 [place]. ? ?Fall Prevention in the Home ?Falls can cause injuries. They can happen to people of all ages. There are many things you can do to make your home safe and to help prevent falls. ?What can I do on the outside of my home? ?Regularly fix the edges of walkways and driveways and fix any cracks. ?Remove anything that might make you trip as you walk through a door, such as a raised step or threshold. ?Trim any bushes or trees on the path to your home. ?Use bright outdoor lighting. ?Clear any walking paths of anything that might make someone trip, such as rocks or tools. ?Regularly check to see if handrails are loose or broken. Make sure that both sides of any steps have handrails. ?Any raised decks and porches should have guardrails on the edges. ?Have any leaves, snow, or ice cleared regularly. ?Use sand or salt on walking paths during winter. ?Clean up any spills in your garage right away. This includes oil or grease spills. ?What can I do in the bathroom? ?Use night lights. ?Install grab bars by the toilet and in the tub and shower. Do not use towel bars as grab bars. ?Use non-skid mats or decals in the tub or shower. ?If you need to sit down in the shower, use a plastic, non-slip stool. ?Keep the floor dry. Clean up any water that spills on the floor  as soon as it happens. ?Remove soap buildup in the tub or shower regularly. ?Attach bath mats securely with double-sided non-slip rug tape. ?Do not have throw rugs and other things on the floor that can make you trip. ?What can I do in the bedroom? ?Use night lights. ?Make sure that you have a light by your bed that is easy to reach. ?Do not use any sheets or blankets that are too big for your bed. They should not hang down onto the floor. ?Have a firm chair that has side arms. You can use this for support while you get dressed. ?Do not have throw rugs and other things on the floor that can make you trip. ?What can I do in the kitchen? ?Clean up any spills right away. ?Avoid walking on wet floors. ?Keep items that you use a lot in easy-to-reach places. ?If you need to reach something above you, use a strong step stool that has a grab bar. ?Keep electrical cords out of the way. ?Do not use floor polish or wax that makes floors slippery. If you must use wax, use non-skid floor wax. ?Do not have throw rugs and other things on the floor that can make you trip. ?What can I do with my stairs? ?Do not leave any items on the stairs. ?Make sure that there are handrails on both sides of the stairs and use them. Fix handrails  that are broken or loose. Make sure that handrails are as long as the stairways. ?Check any carpeting to make sure that it is firmly attached to the stairs. Fix any carpet that is loose or worn. ?Avoid having throw rugs at the top or bottom of the stairs. If you do have throw rugs, attach them to the floor with carpet tape. ?Make sure that you have a light switch at the top of the stairs and the bottom of the stairs. If you do not have them, ask someone to add them for you. ?What else can I do to help prevent falls? ?Wear shoes that: ?Do not have high heels. ?Have rubber bottoms. ?Are comfortable and fit you well. ?Are closed at the toe. Do not wear sandals. ?If you use a stepladder: ?Make sure that it is  fully opened. Do not climb a closed stepladder. ?Make sure that both sides of the stepladder are locked into place. ?Ask someone to hold it for you, if possible. ?Clearly mark and make sure that you can see: ?

## 2021-04-20 NOTE — Progress Notes (Addendum)
? ?Subjective:  ? Sandy Summers is a 81 y.o. female who presents for an Initial Medicare Annual Wellness Visit. ?Virtual Visit via Telephone Note ? ?I connected with  Sandy Summers on 04/20/21 at  8:45 AM EDT by telephone and verified that I am speaking with the correct person using two identifiers. ? ?Location: ?Patient: HOME ?Provider: [facility] ?Persons participating in the virtual visit: patient/Nurse Health Advisor ?  ?I discussed the limitations, risks, security and privacy concerns of performing an evaluation and management service by telephone and the availability of in person appointments. The patient expressed understanding and agreed to proceed. ? ?Interactive audio and video telecommunications were attempted between this nurse and patient, however failed, due to patient having technical difficulties OR patient did not have access to video capability.  We continued and completed visit with audio only. ? ?Some vital signs may be absent or patient reported.  ? ?Chriss Driver, LPN ? ?Review of Systems    ? ?Cardiac Risk Factors include: advanced age (>28men, >8 women);hypertension ? ?   ?Objective:  ?  ?Today's Vitals  ? 04/20/21 0847  ?Weight: 110 lb (49.9 kg)  ?Height: 4\' 11"  (1.499 m)  ? ?Body mass index is 22.22 kg/m?. ? ? ?  04/20/2021  ?  8:56 AM 11/15/2020  ? 11:44 PM 04/29/2020  ?  4:36 PM 01/19/2017  ?  1:35 PM  ?Advanced Directives  ?Does Patient Have a Medical Advance Directive? Yes Yes No No  ?Type of Paramedic of [place];Living will [place];Living will    ?Does patient want to make changes to medical advance directive?  No - Patient declined    ?Copy of [place] in Chart? No - copy requested     ?Would patient like information on creating a medical advance directive?   No - Patient declined   ? ? ?Current Medications (verified) ?Outpatient Encounter Medications as of 04/20/2021  ?Medication Sig  ? aspirin 81 MG chewable  tablet Chew 81 mg by mouth daily.    ? Cholecalciferol 25 MCG (1000 UT) tablet Take 1,000 Units by mouth daily.  ? HYDROcodone-acetaminophen (NORCO/VICODIN) 5-325 MG tablet Take 1-2 tablets by mouth every 6 (six) hours as needed for moderate pain.  ? levothyroxine (SYNTHROID, LEVOTHROID) 50 MCG tablet Take 50 mcg by mouth daily.    ? lisinopril (ZESTRIL) 5 MG tablet Take 1 tablet (5 mg total) by mouth daily.  ? LORazepam (ATIVAN) 1 MG tablet Take 0.5 tablets (0.5 mg total) by mouth at bedtime. For sleep  ? metoprolol succinate (TOPROL-XL) 50 MG 24 hr tablet Take 1 tablet (50 mg total) by mouth at bedtime.  ? Multiple Vitamins-Minerals (PRESERVISION AREDS 2 PO) Take 1 capsule by mouth daily.  ? senna-docusate (SENOKOT-S) 8.6-50 MG tablet Take 1 tablet by mouth at bedtime as needed for mild constipation.  ? traZODone (DESYREL) 50 MG tablet Take 0.5-1 tablets (25-50 mg total) by mouth at bedtime as needed for sleep.  ? atorvastatin (LIPITOR) 20 MG tablet Take 1 tablet (20 mg total) by mouth daily.  ? enoxaparin (LOVENOX) 40 MG/0.4ML injection Inject 0.4 mLs (40 mg total) into the skin daily.  ? ?No facility-administered encounter medications on file as of 04/20/2021.  ? ? ?Allergies (verified) ?Alendronate, Amoxicillin-pot clavulanate, Denosumab, Gabapentin, and Hydrocodone-acetaminophen  ? ?History: ?Past Medical History:  ?Diagnosis Date  ? Hypertension   ? Thyroid disease   ? ?Past Surgical History:  ?Procedure Laterality Date  ?  CORONARY ANGIOPLASTY WITH STENT PLACEMENT  in 2002  ? HIP PINNING,CANNULATED Left 11/16/2020  ? Procedure: CANNULATED HIP PINNING;  Surgeon: Georgeanna Diveley, MD;  Location: [facility];  Service: Orthopedics;  Laterality: Left;  synthes, cannulated screws, hanna table, large c-arm,    can go as early as 2 p.m.  ? ?No family history on file. ?Social History  ? ?Socioeconomic History  ? Marital status: Married  ?  Spouse name: Not on file  ? Number of children: Not on file  ? Years of education: Not  on file  ? Highest education level: Not on file  ?Occupational History  ? Not on file  ?Tobacco Use  ? Smoking status: Former  ?  Types: Cigarettes  ?  Quit date: 10/06/1995  ?  Years since quitting: 25.5  ? Smokeless tobacco: Never  ?Substance and Sexual Activity  ? Alcohol use: No  ? Drug use: No  ? Sexual activity: Not Currently  ?Other Topics Concern  ? Not on file  ?Social History Narrative  ? Not on file  ? ?Social Determinants of Health  ? ?Financial Resource Strain: Low Risk   ? Difficulty of Paying Living Expenses: Not hard at all  ?Food Insecurity: No Food Insecurity  ? Worried About Charity fundraiser in the Last Year: Never true  ? Ran Out of Food in the Last Year: Never true  ?Transportation Needs: No Transportation Needs  ? Lack of Transportation (Medical): No  ? Lack of Transportation (Non-Medical): No  ?Physical Activity: Sufficiently Active  ? Days of Exercise per Week: 7 days  ? Minutes of Exercise per Session: 30 min  ?Stress: No Stress Concern Present  ? Feeling of Stress : Not at all  ?Social Connections: Moderately Integrated  ? Frequency of Communication with Friends and Family: More than three times a week  ? Frequency of Social Gatherings with Friends and Family: More than three times a week  ? Attends Religious Services: Never  ? Active Member of Clubs or Organizations: Yes  ? Attends Archivist Meetings: More than 4 times per year  ? Marital Status: Married  ? ? ?Tobacco Counseling ?Counseling given: Not Answered ? ? ?Clinical Intake: ? ?Pre-visit preparation completed: Yes ? ?Pain : No/denies pain ? ?  ? ?BMI - recorded: 22.22 ?Nutritional Status: BMI of 19-24  Normal ?Nutritional Risks: None ?Diabetes: No ? ?How often do you need to have someone help you when you read instructions, pamphlets, or other written materials from your doctor or pharmacy?: 1 - Never ? ?Diabetic?NO ? ?Interpreter Needed?: No ? ?Information entered by :: mj Josejulian Tarango, lpn ? ? ?Activities of Daily  Living ? ?  04/20/2021  ?  8:59 AM 11/15/2020  ? 11:44 PM  ?In your present state of health, do you have any difficulty performing the following activities:  ?Hearing? 0 1  ?Vision? 0 0  ?Difficulty concentrating or making decisions? 0 0  ?Walking or climbing stairs? 0 0  ?Dressing or bathing? 0 0  ?Doing errands, shopping? 0 0  ?Preparing Food and eating ? N   ?Using the Toilet? N   ?In the past six months, have you accidently leaked urine? N   ?Do you have problems with loss of bowel control? N   ?Managing your Medications? N   ?Managing your Finances? N   ?Housekeeping or managing your Housekeeping? N   ? ? ?Patient Care Team: ?Saguier, Iris Pert as PCP - General (Internal Medicine) ?Clark-Burning, Anderson Malta, PA-C (  Inactive) (Dermatology) ? ?Indicate any recent Medical Services you may have received from other than [facility] providers in the past year (date may be approximate). ? ?   ?Assessment:  ? This is a routine wellness examination for [facility]. ? ?Hearing/Vision screen ?Hearing Screening - Comments:: No hearing issues.  ?Vision Screening - Comments:: Glasses. 05/01/2020 Dr. Prudencio Burly. ? ?Dietary issues and exercise activities discussed: ?Current Exercise Habits: Home exercise routine, Type of exercise: walking, Time (Minutes): 30, Frequency (Times/Week): 5, Weekly Exercise (Minutes/Week): 150, Intensity: Mild, Exercise limited by: cardiac condition(s) ? ? Goals Addressed   ? ?  ?  ?  ?  ? This Visit's Progress  ?  Exercise 3x per week (30 min per time)     ?  Continue to exercise and stay healthy.  ?  ? ?  ? ?Depression Screen ? ?  04/20/2021  ?  8:51 AM 01/23/2021  ? 10:11 AM  ?PHQ 2/9 Scores  ?PHQ - 2 Score 0 0  ?  ?Fall Risk ? ?  04/20/2021  ?  8:57 AM  ?Fall Risk   ?Falls in the past year? 1  ?Number falls in past yr: 0  ?Injury with Fall? 1  ?Risk for fall due to : History of fall(s);Impaired balance/gait  ?Follow up Falls prevention discussed  ? ? ?FALL RISK PREVENTION PERTAINING TO THE HOME: ? ?Any stairs in or  around the home? Yes  ?If so, are there any without handrails? No  ?Home free of loose throw rugs in walkways, pet beds, electrical cords, etc? Yes  ?Adequate lighting in your home to reduce risk of falls? Yes  ? ?AS

## 2021-04-23 ENCOUNTER — Other Ambulatory Visit: Payer: Self-pay | Admitting: Medical

## 2021-05-01 DIAGNOSIS — H2512 Age-related nuclear cataract, left eye: Secondary | ICD-10-CM | POA: Diagnosis not present

## 2021-05-01 DIAGNOSIS — H40053 Ocular hypertension, bilateral: Secondary | ICD-10-CM | POA: Diagnosis not present

## 2021-05-01 DIAGNOSIS — H353132 Nonexudative age-related macular degeneration, bilateral, intermediate dry stage: Secondary | ICD-10-CM | POA: Diagnosis not present

## 2021-05-09 ENCOUNTER — Other Ambulatory Visit: Payer: Self-pay | Admitting: Medical

## 2021-05-09 NOTE — Telephone Encounter (Signed)
Rx trazadone refilled. 

## 2021-05-21 ENCOUNTER — Other Ambulatory Visit: Payer: Self-pay | Admitting: Medical

## 2021-06-04 ENCOUNTER — Other Ambulatory Visit: Payer: Self-pay | Admitting: Medical

## 2021-06-15 ENCOUNTER — Other Ambulatory Visit: Payer: Self-pay | Admitting: Medical

## 2021-06-22 IMAGING — CR DG CHEST 2V
2 series · 2 of 2 positions shown · non-contrast
Comparison: None.

CLINICAL DATA: Severe indigestion.

EXAM:
CHEST - 2 VIEW

[w chest pa]
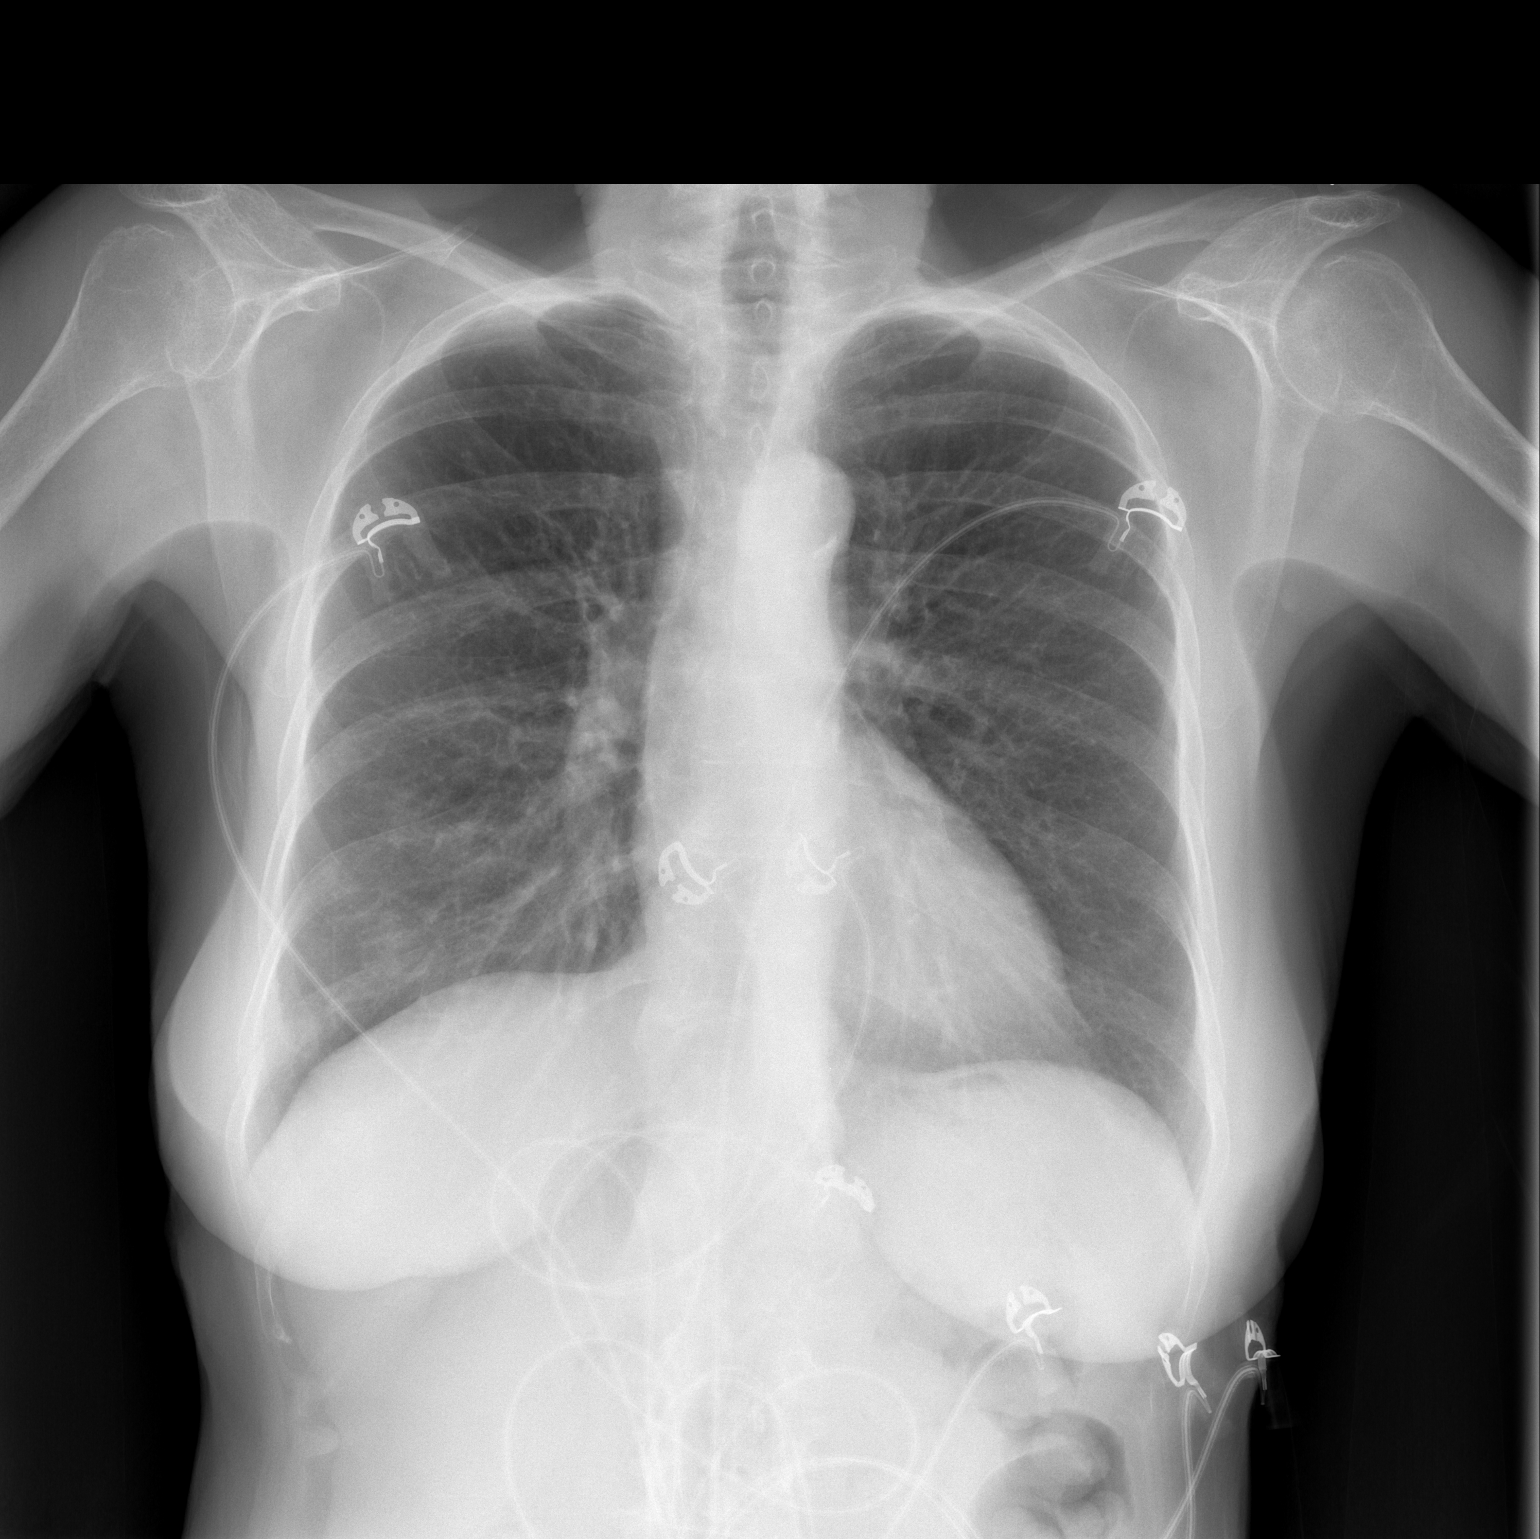

[w chest lat]
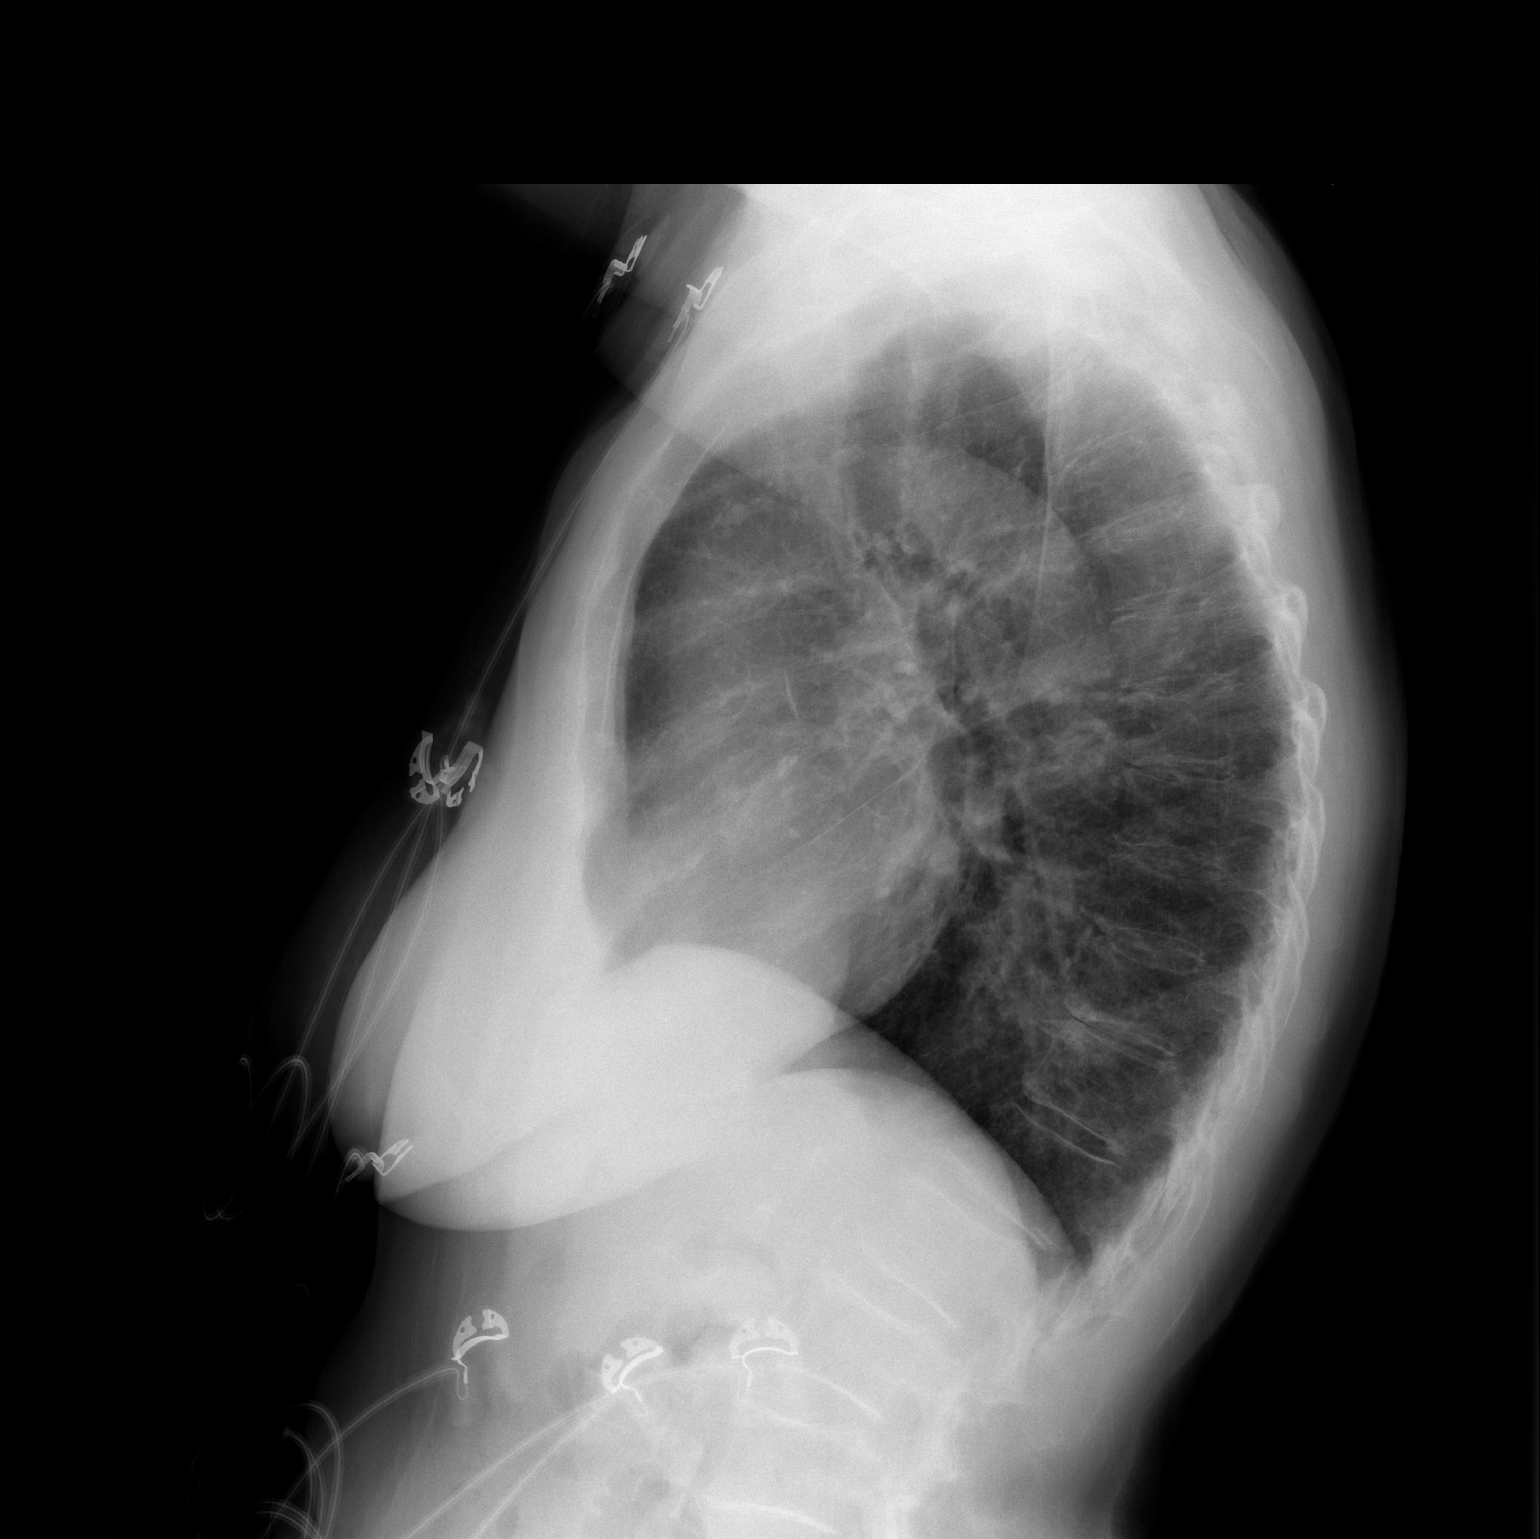

[2 of 2 positions shown; findings below may reference images not displayed]

FINDINGS: The heart size and mediastinal contours are within normal limits.
Both lungs are clear. The visualized skeletal structures are
unremarkable.
IMPRESSION: No active cardiopulmonary disease.

## 2021-07-02 ENCOUNTER — Other Ambulatory Visit: Payer: Self-pay | Admitting: Medical

## 2021-07-30 ENCOUNTER — Other Ambulatory Visit: Payer: Self-pay | Admitting: Medical

## 2021-07-31 ENCOUNTER — Other Ambulatory Visit: Payer: Self-pay | Admitting: Medical

## 2021-08-08 ENCOUNTER — Ambulatory Visit (INDEPENDENT_AMBULATORY_CARE_PROVIDER_SITE_OTHER): Payer: Medicare Other | Admitting: Medical

## 2021-08-08 ENCOUNTER — Encounter: Payer: Self-pay | Admitting: Medical

## 2021-08-08 VITALS — BP 138/70 | HR 75 | Temp 98.1°F | Ht 59.0 in | Wt 110.4 lb

## 2021-08-08 DIAGNOSIS — G4700 Insomnia, unspecified: Secondary | ICD-10-CM

## 2021-08-08 DIAGNOSIS — I10 Essential (primary) hypertension: Secondary | ICD-10-CM | POA: Diagnosis not present

## 2021-08-08 DIAGNOSIS — E039 Hypothyroidism, unspecified: Secondary | ICD-10-CM | POA: Diagnosis not present

## 2021-08-08 DIAGNOSIS — E785 Hyperlipidemia, unspecified: Secondary | ICD-10-CM

## 2021-08-08 LAB — LIPID PANEL
Cholesterol: 129 mg/dL (ref 0–200)
HDL: 49.4 mg/dL (ref >39.00)
LDL Cholesterol: 57 mg/dL (ref 0–99)
NonHDL: 79.93
Total CHOL/HDL Ratio: 3
Triglycerides: 115 mg/dL (ref 0.0–149.0)
VLDL: 23 mg/dL (ref 0.0–40.0)

## 2021-08-08 LAB — COMPREHENSIVE METABOLIC PANEL
ALT: 43 U/L — ABNORMAL HIGH (ref 0–35)
AST: 46 U/L — ABNORMAL HIGH (ref 0–37)
Albumin: 4.2 g/dL (ref 3.5–5.2)
Alkaline Phosphatase: 76 U/L (ref 39–117)
BUN: 24 mg/dL — ABNORMAL HIGH (ref 6–23)
CO2: 26 mEq/L (ref 19–32)
Calcium: 9.2 mg/dL (ref 8.4–10.5)
Chloride: 104 mEq/L (ref 96–112)
Creatinine, Ser: 0.97 mg/dL (ref 0.40–1.20)
GFR: 55.17 mL/min — ABNORMAL LOW (ref >60.00)
Glucose, Bld: 93 mg/dL (ref 70–99)
Potassium: 4.3 mEq/L (ref 3.5–5.1)
Sodium: 140 mEq/L (ref 135–145)
Total Bilirubin: 0.4 mg/dL (ref 0.2–1.2)
Total Protein: 6.8 g/dL (ref 6.0–8.3)

## 2021-08-08 LAB — TSH: TSH: 3.62 u[IU]/mL (ref 0.35–5.50)

## 2021-08-08 LAB — T4, FREE: Free T4: 0.74 ng/dL (ref 0.60–1.60)

## 2021-08-08 NOTE — Patient Instructions (Addendum)
Htn-bp is borderline mild high. Continue  Lisinopril 5 mg dail and toprol xl 50 mg at night. Check bp at home when relaxed. Make sure staying less than 140/90.  Hypothyroid- check tsh and t4 today. On levothyroxine.  High cholesterol. Check cmp and lipid panel today. On atorvastatin 20 mg daily.  Insomnia- continue trazadone.   Discussed you history of fall and hip fracture. Also very active walking 4 miles a day. Be careful and use walker if needed.  Follow up 3-6 month. Date depends on lab results. Or as needed.

## 2021-08-08 NOTE — Progress Notes (Signed)
Subjective:    Patient ID: Sandy Summers, female    DOB: [birth year], 81 y.o.   MRN: [id number]  HPI  Htn- bp initially mild high. Lisinopril 5 mg dail and toprol xl 50 mg at night.  Hypothyroid- on levothyroxine.  Hyperlipidemia- on atorvastatin 20 mg daily.  She has insomnia. Pt is on trazadone 50 mg . Pt states she states take 1/2-1 tab at night.    Review of Systems  Constitutional:  Negative for chills, fatigue and fever.  HENT:  Negative for congestion and ear discharge.   Respiratory:  Negative for cough, chest tightness, shortness of breath and wheezing.   Cardiovascular:  Negative for chest pain and palpitations.  Gastrointestinal:  Negative for abdominal pain.  Musculoskeletal:  Negative for back pain.       Faint residual hip pain post hip fracture and surgery.  Skin:  Negative for rash.  Psychiatric/Behavioral:  Negative for behavioral problems, decreased concentration and dysphoric mood.    Past Medical History:  Diagnosis Date   Hypertension    Thyroid disease      Social History   Socioeconomic History   Marital status: Married    Spouse name: Not on file   Number of children: Not on file   Years of education: Not on file   Highest education level: Not on file  Occupational History   Not on file  Tobacco Use   Smoking status: Former    Types: Cigarettes    Quit date: 10/06/1995    Years since quitting: 25.8   Smokeless tobacco: Never  Substance and Sexual Activity   Alcohol use: No   Drug use: No   Sexual activity: Not Currently  Other Topics Concern   Not on file  Social History Narrative   Not on file   Social Determinants of Health   Financial Resource Strain: Low Risk  (04/20/2021)   Overall Financial Resource Strain (CARDIA)    Difficulty of Paying Living Expenses: Not hard at all  Food Insecurity: No Food Insecurity (04/20/2021)   Hunger Vital Sign    Worried About Running Out of Food in the Last Year: Never true    Ran Out of  Food in the Last Year: Never true  Transportation Needs: No Transportation Needs (04/20/2021)   PRAPARE - Administrator, Civil Service (Medical): No    Lack of Transportation (Non-Medical): No  Physical Activity: Sufficiently Active (04/20/2021)   Exercise Vital Sign    Days of Exercise per Week: 7 days    Minutes of Exercise per Session: 30 min  Stress: No Stress Concern Present (04/20/2021)   [employer] of Occupational Health - Occupational Stress Questionnaire    Feeling of Stress : Not at all  Social Connections: Moderately Integrated (04/20/2021)   Social Connection and Isolation Panel [NHANES]    Frequency of Communication with Friends and Family: More than three times a week    Frequency of Social Gatherings with Friends and Family: More than three times a week    Attends Religious Services: Never    Database administrator or Organizations: Yes    Attends Engineer, structural: More than 4 times per year    Marital Status: Married  Catering manager Violence: Not At Risk (04/20/2021)   Humiliation, Afraid, Rape, and Kick questionnaire    Fear of Current or Ex-Partner: No    Emotionally Abused: No    Physically Abused: No    Sexually Abused: No  Past Surgical History:  Procedure Laterality Date   CORONARY ANGIOPLASTY WITH STENT PLACEMENT  in 2002   HIP PINNING,CANNULATED Left 11/16/2020   Procedure: CANNULATED HIP PINNING;  Surgeon: Ernestina Columbia, MD;  Location: [facility];  Service: Orthopedics;  Laterality: Left;  synthes, cannulated screws, hanna table, large c-arm,    can go as early as 2 p.m.    No family history on file.  Allergies  Allergen Reactions   Alendronate Nausea Only   Amoxicillin-Pot Clavulanate Diarrhea   Denosumab Other (See Comments)    I just didn't like it    Gabapentin Other (See Comments)    Ineffective for sleep "made me crazy"   Hydrocodone-Acetaminophen Other (See Comments)    Current Outpatient Medications on File  Prior to Visit  Medication Sig Dispense Refill   aspirin 81 MG chewable tablet Chew 81 mg by mouth daily.       atorvastatin (LIPITOR) 20 MG tablet Take 1 tablet by mouth once daily 30 tablet 0   Cholecalciferol 25 MCG (1000 UT) tablet Take 1,000 Units by mouth daily.     levothyroxine (SYNTHROID, LEVOTHROID) 50 MCG tablet Take 50 mcg by mouth daily.       lisinopril (ZESTRIL) 5 MG tablet Take 1 tablet by mouth once daily 90 tablet 0   LORazepam (ATIVAN) 1 MG tablet Take 0.5 tablets (0.5 mg total) by mouth at bedtime. For sleep 15 tablet 0   metoprolol succinate (TOPROL-XL) 50 MG 24 hr tablet Take 1 tablet (50 mg total) by mouth at bedtime. 90 tablet 3   Multiple Vitamins-Minerals (PRESERVISION AREDS 2 PO) Take 1 capsule by mouth daily.     senna-docusate (SENOKOT-S) 8.6-50 MG tablet Take 1 tablet by mouth at bedtime as needed for mild constipation. 30 tablet 0   traZODone (DESYREL) 50 MG tablet TAKE 1/2 TO 1 (ONE-HALF TO ONE) TABLET BY MOUTH ONCE DAILY AT BEDTIME AS NEEDED FOR SLEEP 30 tablet 0   No current facility-administered medications on file prior to visit.    BP 138/70   Pulse 75   Temp 98.1 F (36.7 C) (Oral)   Ht 4\' 11"  (1.499 m)   Wt 110 lb 6.4 oz (50.1 kg)   SpO2 100%   BMI 22.30 kg/m           Objective:   Physical Exam  General Mental Status- Alert. General Appearance- Not in acute distress.   Skin General: Color- Normal Color. Moisture- Normal Moisture.   Chest and Lung Exam Auscultation: Breath Sounds:-Normal.  Cardiovascular Auscultation:Rythm- Regular. Murmurs & Other Heart Sounds:Auscultation of the heart reveals- No Murmurs.   Neurologic Cranial Nerve exam:- CN III-XII intact(No nystagmus), symmetric smile. Strength:- 5/5 equal and symmetric strength both upper and lower extremities.       Assessment & Plan:   Patient Instructions  Htn-bp is borderline mild high. Continue  Lisinopril 5 mg dail and toprol xl 50 mg at night. Check bp at  home when relaxed. Make sure staying less than 140/90.  Hypothyroid- check tsh and t4 today. On levothyroxine.  High cholesterol. Check cmp and lipid panel today. On atorvastatin 20 mg daily.  Insomnia- continue trazadone.   Discussed you history of fall and hip fracture. Also very active walking 4 miles a day. Be careful and use walker if needed.  Follow up 3-6 month. Date depends on lab results. Or as needed.    , PA-C

## 2021-08-10 ENCOUNTER — Telehealth: Payer: Self-pay | Admitting: Medical

## 2021-08-10 NOTE — Telephone Encounter (Signed)
Patient advised of results and provider's comments 

## 2021-08-10 NOTE — Telephone Encounter (Signed)
Patient returned call for labs. Advised I would have someone call her back to discuss.

## 2021-08-23 ENCOUNTER — Other Ambulatory Visit: Payer: Self-pay | Admitting: Medical

## 2021-08-27 ENCOUNTER — Encounter (HOSPITAL_BASED_OUTPATIENT_CLINIC_OR_DEPARTMENT_OTHER): Payer: Self-pay | Admitting: Emergency Medicine

## 2021-08-27 ENCOUNTER — Emergency Department (HOSPITAL_BASED_OUTPATIENT_CLINIC_OR_DEPARTMENT_OTHER)
Admission: EM | Admit: 2021-08-27 | Discharge: 2021-08-27 | Disposition: A | Discharge status: Home / Self Care | Payer: Medicare Other | Attending: Emergency Medicine | Admitting: Emergency Medicine

## 2021-08-27 ENCOUNTER — Other Ambulatory Visit: Payer: Self-pay

## 2021-08-27 DIAGNOSIS — Z7982 Long term (current) use of aspirin: Secondary | ICD-10-CM | POA: Insufficient documentation

## 2021-08-27 DIAGNOSIS — S51811A Laceration without foreign body of right forearm, initial encounter: Secondary | ICD-10-CM | POA: Insufficient documentation

## 2021-08-27 DIAGNOSIS — Z79899 Other long term (current) drug therapy: Secondary | ICD-10-CM | POA: Insufficient documentation

## 2021-08-27 DIAGNOSIS — W540XXA Bitten by dog, initial encounter: Secondary | ICD-10-CM | POA: Diagnosis not present

## 2021-08-27 DIAGNOSIS — S51819A Laceration without foreign body of unspecified forearm, initial encounter: Secondary | ICD-10-CM

## 2021-08-27 DIAGNOSIS — S59911A Unspecified injury of right forearm, initial encounter: Secondary | ICD-10-CM | POA: Diagnosis present

## 2021-08-27 MED ORDER — DOXYCYCLINE HYCLATE 100 MG PO CAPS: 100.0000 mg | ORAL_CAPSULE | Freq: Two times a day (BID) | ORAL | 0 refills | Status: DC

## 2021-08-27 NOTE — Discharge Instructions (Addendum)
Return for redness, drainage, fever.    

## 2021-08-27 NOTE — ED Provider Notes (Signed)
[facility] EMERGENCY DEPARTMENT Provider Note   CSN: [id number] Arrival date & time: 08/27/21  7564     History  Chief Complaint  Patient presents with   Skin tear    Sandy Summers is a 81 y.o. female.  81 yo F with a cc of an injury to her right forearm.  The patient was walking her dog and another dog jumped up on her and scratched her forearm.  This is just prior to arrival.  Denies other injury.        Home Medications Prior to Admission medications   Medication Sig Start Date End Date Taking? Authorizing Provider  doxycycline (VIBRAMYCIN) 100 MG capsule Take 1 capsule (100 mg total) by mouth 2 (two) times daily. One po bid x 7 days 08/27/21  Yes Melene Plan, DO  aspirin 81 MG chewable tablet Chew 81 mg by mouth daily.      [provider]  atorvastatin (LIPITOR) 20 MG tablet Take 1 tablet by mouth once daily 07/31/21   Saguier, Ramon Dredge, PA-C  Cholecalciferol 25 MCG (1000 UT) tablet Take 1,000 Units by mouth daily.    [provider]  levothyroxine (SYNTHROID, LEVOTHROID) 50 MCG tablet Take 50 mcg by mouth daily.      [provider]  lisinopril (ZESTRIL) 5 MG tablet Take 1 tablet by mouth once daily 07/31/21   Saguier, Ramon Dredge, PA-C  LORazepam (ATIVAN) 1 MG tablet Take 0.5 tablets (0.5 mg total) by mouth at bedtime. For sleep 12/22/20   Saguier, Ramon Dredge, PA-C  metoprolol succinate (TOPROL-XL) 50 MG 24 hr tablet Take 1 tablet (50 mg total) by mouth at bedtime. 03/02/21   Saguier, Ramon Dredge, PA-C  Multiple Vitamins-Minerals (PRESERVISION AREDS 2 PO) Take 1 capsule by mouth daily.    [provider]  senna-docusate (SENOKOT-S) 8.6-50 MG tablet Take 1 tablet by mouth at bedtime as needed for mild constipation. 11/18/20   Jerald Kief, MD  traZODone (DESYREL) 50 MG tablet TAKE 1/2 TO 1 (ONE-HALF TO ONE) TABLET BY MOUTH AT BEDTIME AS NEEDED FOR SLEEP 08/23/21   Saguier, Ramon Dredge, PA-C      Allergies    Alendronate, Amoxicillin-pot  clavulanate, Denosumab, Gabapentin, and Hydrocodone-acetaminophen    Review of Systems   Review of Systems  Physical Exam Updated Vital Signs BP (!) 169/77 (BP Location: Left Arm)   Pulse 69   Temp 98.4 F (36.9 C) (Oral)   Resp 20   Ht 4\' 11"  (1.499 m)   Wt 49.9 kg   SpO2 98%   BMI 22.22 kg/m  Physical Exam Vitals and nursing note reviewed.  Constitutional:      General: She is not in acute distress.    Appearance: She is well-developed. She is not diaphoretic.  HENT:     Head: Normocephalic and atraumatic.  Eyes:     Pupils: Pupils are equal, round, and reactive to light.  Cardiovascular:     Rate and Rhythm: Normal rate and regular rhythm.     Heart sounds: No murmur heard.    No friction rub. No gallop.  Pulmonary:     Effort: Pulmonary effort is normal.     Breath sounds: No wheezing or rales.  Abdominal:     General: There is no distension.     Palpations: Abdomen is soft.     Tenderness: There is no abdominal tenderness.  Musculoskeletal:        General: No tenderness.     Cervical back: Normal range of motion  and neck supple.     Comments: Skin tear to the dorsal aspect of the right forearm, approximately 2 cm x 2 cm flap.  Superficial.  No bony tenderness.  Skin:    General: Skin is warm and dry.  Neurological:     Mental Status: She is alert and oriented to person, place, and time.  Psychiatric:        Behavior: Behavior normal.     ED Results / Procedures / Treatments   Labs (all labs ordered are listed, but only abnormal results are displayed) Labs Reviewed - No data to display  EKG None  Radiology No results found.  Procedures Procedures    Medications Ordered in ED Medications - No data to display  ED Course/ Medical Decision Making/ A&P                           Medical Decision Making Risk Prescription drug management.   81 yo F with a chief complaints of a skin tear to the right forearm.  This was due to dog jumping on her  earlier this morning.  Will irrigate at bedside and closed with Steri-Strips.  Started on doxycycline as the patient has an intolerance to Augmentin.  I reviewed the patient's medical record and her most recent tetanus was within the year.  7:53 AM:  I have discussed the diagnosis/risks/treatment options with the patient and family.  Evaluation and diagnostic testing in the emergency department does not suggest an emergent condition requiring admission or immediate intervention beyond what has been performed at this time.  They will follow up with PCP. We also discussed returning to the ED immediately if new or worsening sx occur. We discussed the sx which are most concerning (e.g., sudden worsening pain, fever, inability to tolerate by mouth) that necessitate immediate return. Medications administered to the patient during their visit and any new prescriptions provided to the patient are listed below.  Medications given during this visit Medications - No data to display   The patient appears reasonably screen and/or stabilized for discharge and I doubt any other medical condition or other [facility] requiring further screening, evaluation, or treatment in the ED at this time prior to discharge.          Final Clinical Impression(s) / ED Diagnoses Final diagnoses:  Skin tear of forearm without complication, initial encounter    Rx / DC Orders ED Discharge Orders          Ordered    doxycycline (VIBRAMYCIN) 100 MG capsule  2 times daily        08/27/21 0751              Melene Plan, DO 08/27/21 [phone]

## 2021-08-27 NOTE — ED Triage Notes (Signed)
States was walking her dog this am and another dog clawed her right arm. Skin tear noted to RFA, bleeding controlled

## 2021-08-27 NOTE — ED Notes (Signed)
Pt discharged to home. Discharge instructions have been discussed with patient and/or family members. Pt verbally acknowledges understanding d/c instructions, and endorses comprehension to checkout at registration before leaving.  °

## 2021-08-28 ENCOUNTER — Other Ambulatory Visit: Payer: Self-pay | Admitting: Medical

## 2021-08-30 ENCOUNTER — Encounter: Payer: Self-pay | Admitting: Family Medicine

## 2021-08-30 ENCOUNTER — Ambulatory Visit (INDEPENDENT_AMBULATORY_CARE_PROVIDER_SITE_OTHER): Payer: Medicare Other | Admitting: Family Medicine

## 2021-08-30 VITALS — BP 140/85 | HR 71 | Temp 97.4°F | Ht 60.0 in | Wt 112.5 lb

## 2021-08-30 DIAGNOSIS — S50811A Abrasion of right forearm, initial encounter: Secondary | ICD-10-CM | POA: Diagnosis not present

## 2021-08-30 DIAGNOSIS — T148XXA Other injury of unspecified body region, initial encounter: Secondary | ICD-10-CM

## 2021-08-30 NOTE — Patient Instructions (Signed)
Stay on your aspirin.  Change your dressings twice daily.   When you do wash it, use only soap and water. Do not vigorously scrub. Apply triple antibiotic ointment (like Neosporin) twice daily when the antibiotic finishes, Vaseline until then. Keep the area clean and dry.   Things to look out for: increasing pain not relieved by ice/acetaminophen, fevers, spreading redness, drainage of pus, or foul odor.  Let us know if you need anything.

## 2021-08-30 NOTE — Progress Notes (Signed)
Chief Complaint  Patient presents with   Arm Injury    Check skin tear     Sandy Summers is a 81 y.o. female here for a skin complaint. Here w spouse.   Duration: 3 days Location: R forearm Pruritic? No Painful? No Drainage? No Dog tore skin on arm Other associated symptoms: bled this AM, no known trauma She is on aspirin Therapies tried thus far: dressing still in place, rx'd doxycycline  Past Medical History:  Diagnosis Date   Hypertension    Thyroid disease     BP (!) 140/85   Pulse 71   Temp (!) 97.4 F (36.3 C) (Oral)   Ht 5' (1.524 m)   Wt 112 lb 8 oz (51 kg)   SpO2 97%   BMI 21.97 kg/m  Gen: awake, alert, appearing stated age Lungs: No accessory muscle use Skin: well approximated skin tear on dorsum of R forearm, mild ttp. No drainage, erythema, fluctuance, excoriation Psych: Age appropriate judgment and insight  Skin abrasion  Cont doxy from ER. Change dressings bid. Vaseline until doxy finishes then TAO bid. Warning signs and symptoms verbalized and written down in AVS. Cont ASA for 2ndary prevention. Tylenol for pain prn.  F/u prn. The patient and her spouse voiced understanding and agreement to the plan.  Jilda Roche [place], DO 08/30/21 12:06 PM

## 2021-09-01 ENCOUNTER — Encounter (HOSPITAL_BASED_OUTPATIENT_CLINIC_OR_DEPARTMENT_OTHER): Payer: Self-pay | Admitting: Emergency Medicine

## 2021-09-01 ENCOUNTER — Other Ambulatory Visit: Payer: Self-pay

## 2021-09-01 ENCOUNTER — Emergency Department (HOSPITAL_BASED_OUTPATIENT_CLINIC_OR_DEPARTMENT_OTHER)
Admission: EM | Admit: 2021-09-01 | Discharge: 2021-09-01 | Disposition: A | Discharge status: Home / Self Care | Payer: Medicare Other | Attending: Emergency Medicine | Admitting: Emergency Medicine

## 2021-09-01 ENCOUNTER — Emergency Department (HOSPITAL_BASED_OUTPATIENT_CLINIC_OR_DEPARTMENT_OTHER): Payer: Medicare Other

## 2021-09-01 ENCOUNTER — Encounter: Payer: Self-pay | Admitting: Medical

## 2021-09-01 DIAGNOSIS — Z955 Presence of coronary angioplasty implant and graft: Secondary | ICD-10-CM | POA: Insufficient documentation

## 2021-09-01 DIAGNOSIS — S2242XA Multiple fractures of ribs, left side, initial encounter for closed fracture: Secondary | ICD-10-CM

## 2021-09-01 DIAGNOSIS — Y92410 Unspecified street and highway as the place of occurrence of the external cause: Secondary | ICD-10-CM | POA: Insufficient documentation

## 2021-09-01 DIAGNOSIS — Z7982 Long term (current) use of aspirin: Secondary | ICD-10-CM | POA: Insufficient documentation

## 2021-09-01 DIAGNOSIS — I700 Atherosclerosis of aorta: Secondary | ICD-10-CM | POA: Diagnosis not present

## 2021-09-01 DIAGNOSIS — R011 Cardiac murmur, unspecified: Secondary | ICD-10-CM

## 2021-09-01 DIAGNOSIS — I10 Essential (primary) hypertension: Secondary | ICD-10-CM | POA: Insufficient documentation

## 2021-09-01 DIAGNOSIS — Z1231 Encounter for screening mammogram for malignant neoplasm of breast: Secondary | ICD-10-CM | POA: Diagnosis not present

## 2021-09-01 DIAGNOSIS — Z79899 Other long term (current) drug therapy: Secondary | ICD-10-CM | POA: Insufficient documentation

## 2021-09-01 DIAGNOSIS — S299XXA Unspecified injury of thorax, initial encounter: Secondary | ICD-10-CM | POA: Diagnosis present
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
## 2021-09-01 NOTE — ED Notes (Signed)
Patient states that she is having discomfort on her left side in her rib area.Denies any sob
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
## 2021-09-11 ENCOUNTER — Ambulatory Visit (INDEPENDENT_AMBULATORY_CARE_PROVIDER_SITE_OTHER): Payer: Medicare Other | Admitting: Family

## 2021-09-11 VITALS — BP 129/62 | HR 74 | Temp 98.2°F | Resp 16 | Wt 108.0 lb

## 2021-09-11 DIAGNOSIS — I350 Nonrheumatic aortic (valve) stenosis: Secondary | ICD-10-CM

## 2021-09-11 DIAGNOSIS — S2232XA Fracture of one rib, left side, initial encounter for closed fracture: Secondary | ICD-10-CM

## 2021-09-11 DIAGNOSIS — I10 Essential (primary) hypertension: Secondary | ICD-10-CM

## 2021-09-11 NOTE — Assessment & Plan Note (Addendum)
New. Recommended tylenol prn pain and salon pas patches prn.  She is advised to let us know if symptoms worsen or if pain does not improve in the coming weeks.  She continues to walk daily and use her incentive spirometer.

## 2021-09-11 NOTE — Assessment & Plan Note (Signed)
Known AS, which is cause for her murmur noted in ED.  She has a cardiologist who is following this and it has been evaluated with 2D echo.
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
## 2021-09-11 NOTE — Assessment & Plan Note (Signed)
BP stable on metoprolol.

## 2021-09-13 DIAGNOSIS — I2510 Atherosclerotic heart disease of native coronary artery without angina pectoris: Secondary | ICD-10-CM | POA: Diagnosis not present

## 2021-09-13 DIAGNOSIS — Z7982 Long term (current) use of aspirin: Secondary | ICD-10-CM | POA: Diagnosis not present

## 2021-09-13 DIAGNOSIS — I350 Nonrheumatic aortic (valve) stenosis: Secondary | ICD-10-CM | POA: Diagnosis not present

## 2021-09-13 DIAGNOSIS — I10 Essential (primary) hypertension: Secondary | ICD-10-CM | POA: Diagnosis not present

## 2021-09-25 DIAGNOSIS — E785 Hyperlipidemia, unspecified: Secondary | ICD-10-CM | POA: Diagnosis not present

## 2021-09-25 DIAGNOSIS — I6523 Occlusion and stenosis of bilateral carotid arteries: Secondary | ICD-10-CM | POA: Diagnosis not present

## 2021-09-25 DIAGNOSIS — I2510 Atherosclerotic heart disease of native coronary artery without angina pectoris: Secondary | ICD-10-CM | POA: Diagnosis not present

## 2021-09-25 DIAGNOSIS — I10 Essential (primary) hypertension: Secondary | ICD-10-CM | POA: Diagnosis not present

## 2021-09-29 ENCOUNTER — Other Ambulatory Visit: Payer: Self-pay | Admitting: Medical

## 2021-10-06 ENCOUNTER — Telehealth: Payer: Self-pay | Admitting: Medical

## 2021-10-06 DIAGNOSIS — I6523 Occlusion and stenosis of bilateral carotid arteries: Secondary | ICD-10-CM | POA: Diagnosis not present

## 2021-10-06 MED ORDER — LEVOTHYROXINE SODIUM 50 MCG PO TABS: 50.0000 ug | ORAL_TABLET | Freq: Every day | ORAL | 2 refills | Status: DC

## 2021-10-06 NOTE — Telephone Encounter (Signed)
Rx sent 

## 2021-10-06 NOTE — Telephone Encounter (Signed)
Medication:   levothyroxine (SYNTHROID, LEVOTHROID) 50 MCG tablet [[id number]]   Has the patient contacted their pharmacy? No. (If no, request that the patient contact the pharmacy for the refill.) (If yes, when and what did the pharmacy advise?)  Preferred Pharmacy (with phone number or street name):   [facility] [address] [place], [place] - [address], [place] [address] Phone: [phone]  Fax: [phone]    Agent: Please be advised that RX refills may take up to 3 business days. We ask that you follow-up with your pharmacy.

## 2021-10-13 DIAGNOSIS — I6522 Occlusion and stenosis of left carotid artery: Secondary | ICD-10-CM | POA: Diagnosis not present

## 2021-10-13 DIAGNOSIS — I350 Nonrheumatic aortic (valve) stenosis: Secondary | ICD-10-CM | POA: Diagnosis not present

## 2021-10-13 DIAGNOSIS — I2510 Atherosclerotic heart disease of native coronary artery without angina pectoris: Secondary | ICD-10-CM | POA: Diagnosis not present

## 2021-10-17 ENCOUNTER — Other Ambulatory Visit: Payer: Self-pay | Admitting: Medical

## 2021-10-27 ENCOUNTER — Other Ambulatory Visit: Payer: Self-pay | Admitting: Medical

## 2021-11-07 DIAGNOSIS — M8588 Other specified disorders of bone density and structure, other site: Secondary | ICD-10-CM | POA: Diagnosis not present

## 2021-11-07 DIAGNOSIS — Z780 Asymptomatic menopausal state: Secondary | ICD-10-CM | POA: Diagnosis not present

## 2021-11-07 DIAGNOSIS — M810 Age-related osteoporosis without current pathological fracture: Secondary | ICD-10-CM | POA: Diagnosis not present

## 2021-11-12 ENCOUNTER — Other Ambulatory Visit: Payer: Self-pay | Admitting: Medical

## 2021-11-14 ENCOUNTER — Other Ambulatory Visit: Payer: Self-pay | Admitting: Medical

## 2021-11-19 ENCOUNTER — Telehealth: Payer: Self-pay | Admitting: Medical

## 2021-11-19 NOTE — Telephone Encounter (Addendum)
Pt bone density does show osteoporosis at various sites. Also hx of fall and fracture. Will prescribe fosamax 10 mg daily to help preserve bone density. Follow up with me in one month or sooner if needed,   Actually when I went to rx med epic stated nausea side effect. Other side effect can occur. Schedule appointment to dicuss risk and benefit of alternatives.

## 2021-11-20 NOTE — Telephone Encounter (Signed)
Pt called and labs reviewed Pt declined appt for medication stated she doesn't want to take medication and she knows she has osteoporosis

## 2021-12-01 ENCOUNTER — Encounter: Payer: Self-pay | Admitting: Medical

## 2021-12-09 ENCOUNTER — Other Ambulatory Visit: Payer: Self-pay | Admitting: Medical

## 2021-12-28 DIAGNOSIS — H353132 Nonexudative age-related macular degeneration, bilateral, intermediate dry stage: Secondary | ICD-10-CM | POA: Diagnosis not present

## 2021-12-28 DIAGNOSIS — H2513 Age-related nuclear cataract, bilateral: Secondary | ICD-10-CM | POA: Diagnosis not present

## 2021-12-28 DIAGNOSIS — H40053 Ocular hypertension, bilateral: Secondary | ICD-10-CM | POA: Diagnosis not present

## 2021-12-29 ENCOUNTER — Other Ambulatory Visit: Payer: Self-pay | Admitting: Medical

## 2022-01-07 ENCOUNTER — Other Ambulatory Visit: Payer: Self-pay | Admitting: Medical

## 2022-01-09 IMAGING — RF DG HIP (WITH PELVIS) OPERATIVE*L*
1 series · 4 of 4 positions shown · non-contrast
Comparison: 11/15/2020

CLINICAL DATA: Operative fluoroscopic imaging provided for left
proximal femur fracture ORIF.

EXAM:
OPERATIVE LEFT HIP (WITH PELVIS IF PERFORMED) 4 VIEWS
TECHNIQUE: Fluoroscopic spot image(s) were submitted for interpretation
post-operatively.

[Series 1: unknown protocol · 0.14mm/px · 4 of 4 slices shown]
[im 1/4]
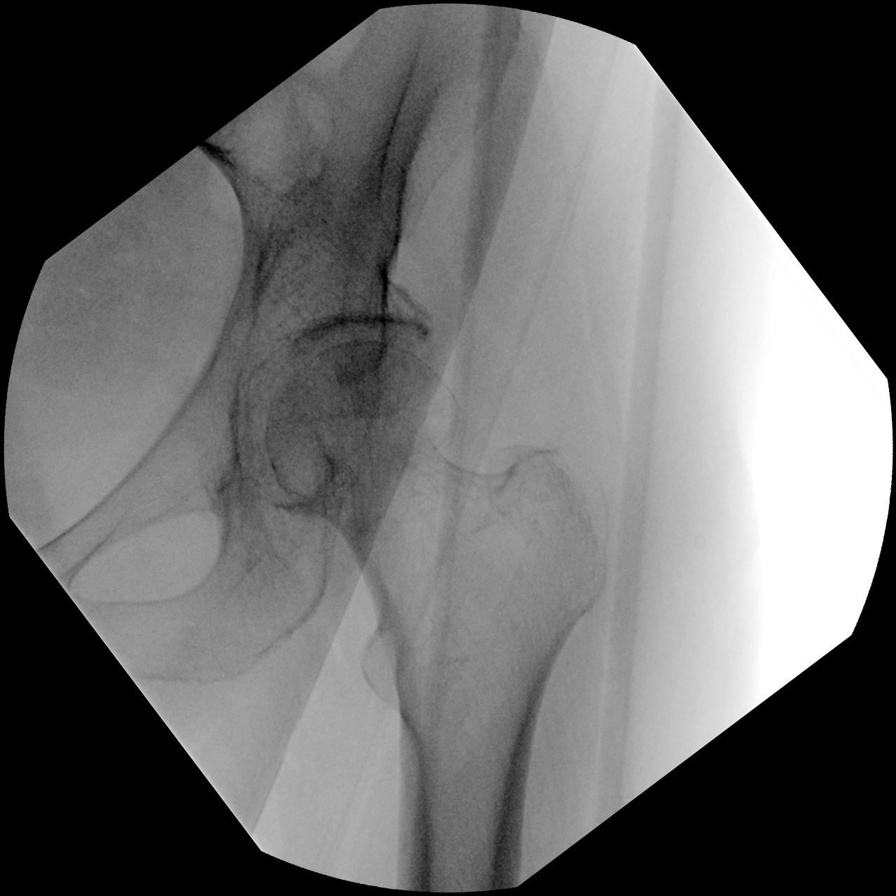
[im 2/4]
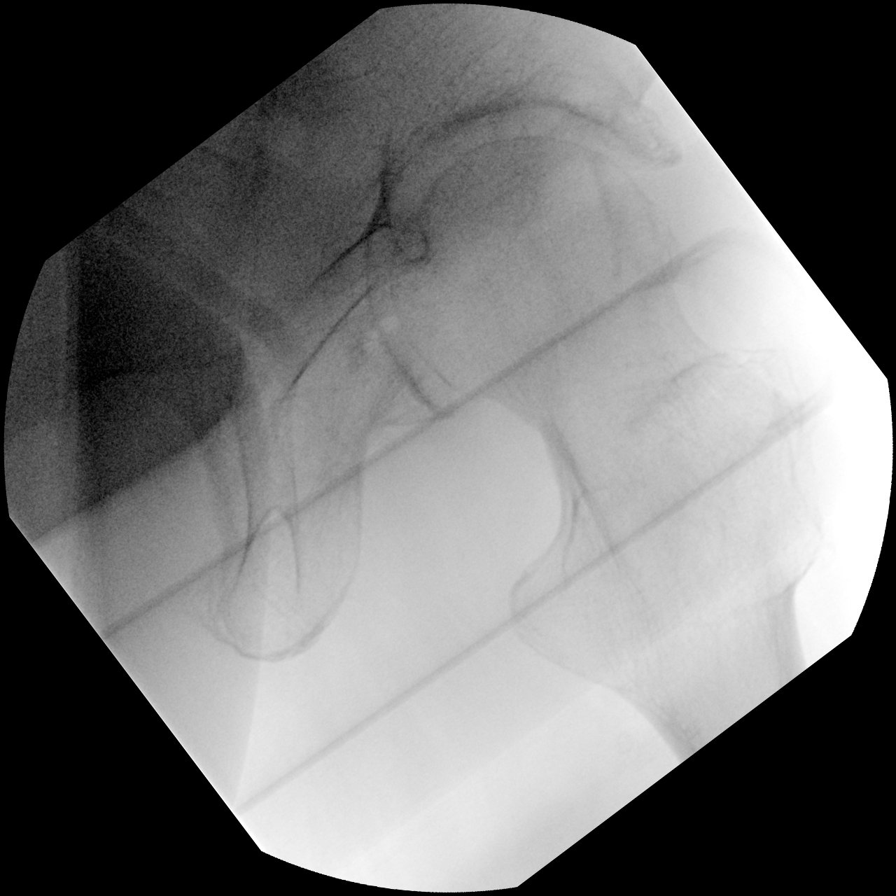
[im 3/4]
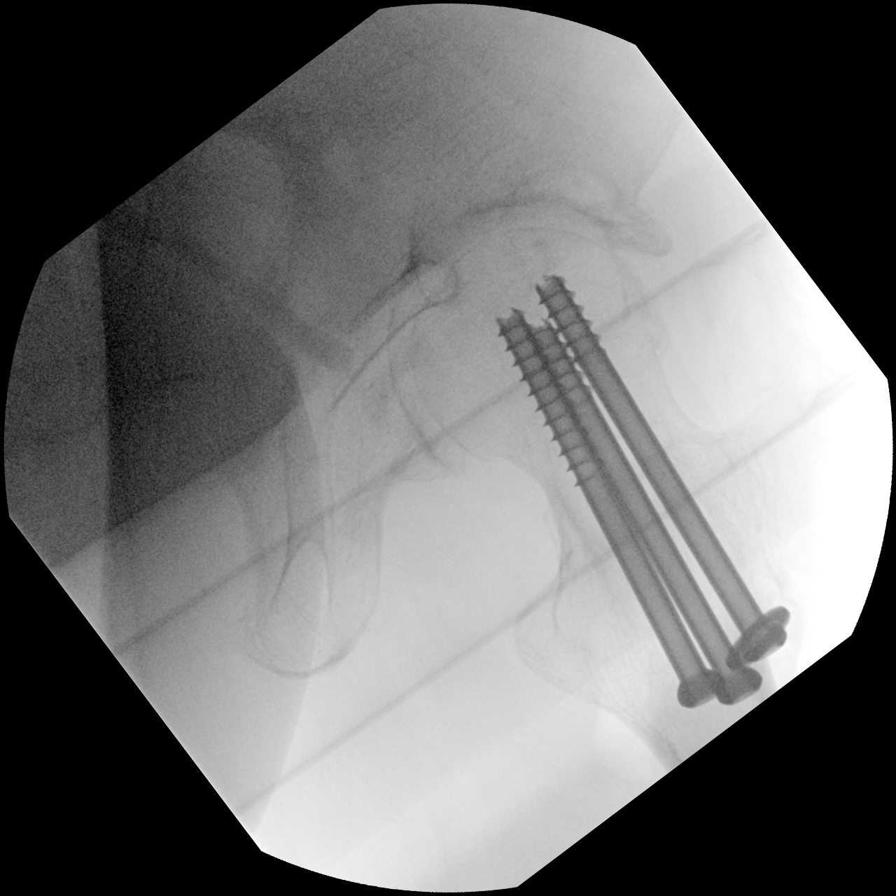
[im 4/4]
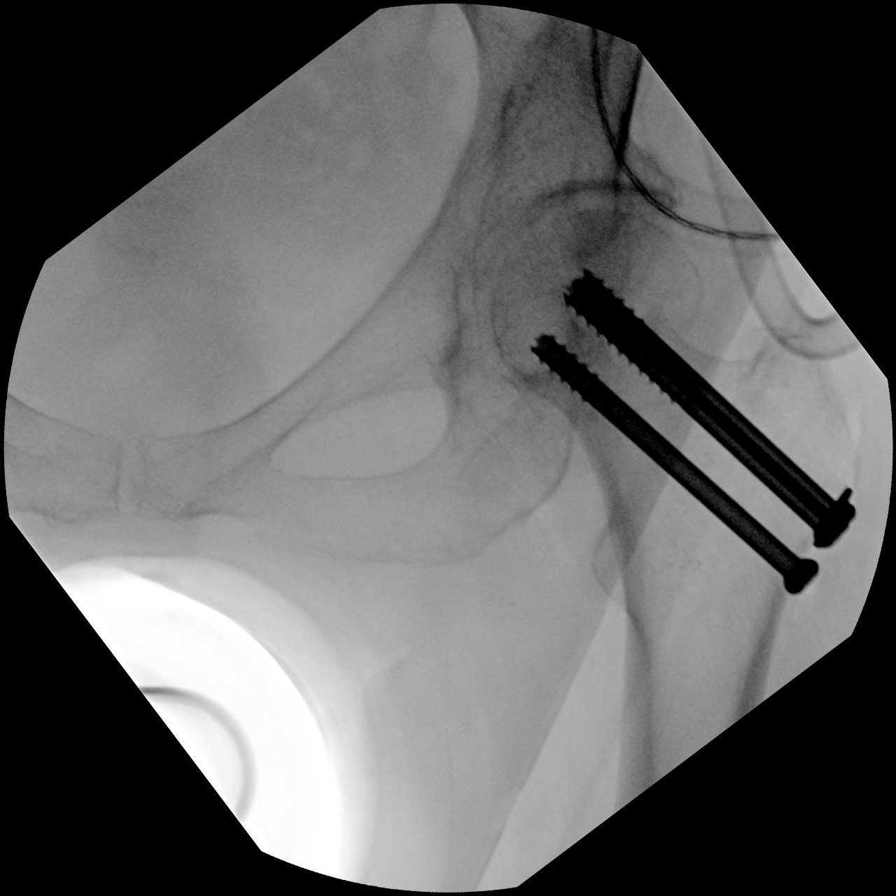

[4 of 4 positions shown; findings below may reference images not displayed]

FINDINGS: Images show placement of 3 screws fixating the subcapital left
femoral neck fracture.
IMPRESSION: 1. Well-positioned left subcapital femoral neck fracture following
ORIF.

## 2022-01-17 DIAGNOSIS — L2089 Other atopic dermatitis: Secondary | ICD-10-CM | POA: Diagnosis not present

## 2022-01-26 ENCOUNTER — Other Ambulatory Visit: Payer: Self-pay | Admitting: Medical

## 2022-01-27 ENCOUNTER — Other Ambulatory Visit: Payer: Self-pay | Admitting: Medical

## 2022-02-06 ENCOUNTER — Other Ambulatory Visit: Payer: Self-pay | Admitting: Medical

## 2022-02-06 DIAGNOSIS — H269 Unspecified cataract: Secondary | ICD-10-CM | POA: Diagnosis not present

## 2022-02-06 DIAGNOSIS — H2512 Age-related nuclear cataract, left eye: Secondary | ICD-10-CM | POA: Diagnosis not present

## 2022-02-06 DIAGNOSIS — H268 Other specified cataract: Secondary | ICD-10-CM | POA: Diagnosis not present

## 2022-02-07 ENCOUNTER — Other Ambulatory Visit: Payer: Self-pay | Admitting: Medical

## 2022-02-12 ENCOUNTER — Ambulatory Visit (INDEPENDENT_AMBULATORY_CARE_PROVIDER_SITE_OTHER): Payer: Medicare Other | Admitting: Medical

## 2022-02-12 ENCOUNTER — Encounter: Payer: Self-pay | Admitting: Medical

## 2022-02-12 VITALS — BP 133/65 | HR 76 | Temp 98.0°F | Resp 18 | Ht <= 58 in | Wt 109.8 lb

## 2022-02-12 DIAGNOSIS — G4700 Insomnia, unspecified: Secondary | ICD-10-CM

## 2022-02-12 DIAGNOSIS — E785 Hyperlipidemia, unspecified: Secondary | ICD-10-CM

## 2022-02-12 DIAGNOSIS — I10 Essential (primary) hypertension: Secondary | ICD-10-CM | POA: Diagnosis not present

## 2022-02-12 DIAGNOSIS — E039 Hypothyroidism, unspecified: Secondary | ICD-10-CM | POA: Diagnosis not present

## 2022-02-12 LAB — COMPREHENSIVE METABOLIC PANEL
ALT: 26 U/L (ref 0–35)
AST: 26 U/L (ref 0–37)
Albumin: 4 g/dL (ref 3.5–5.2)
Alkaline Phosphatase: 64 U/L (ref 39–117)
BUN: 22 mg/dL (ref 6–23)
CO2: 25 mEq/L (ref 19–32)
Calcium: 9.7 mg/dL (ref 8.4–10.5)
Chloride: 105 mEq/L (ref 96–112)
Creatinine, Ser: 0.94 mg/dL (ref 0.40–1.20)
GFR: 57.08 mL/min — ABNORMAL LOW (ref >60.00)
Glucose, Bld: 87 mg/dL (ref 70–99)
Potassium: 4.3 mEq/L (ref 3.5–5.1)
Sodium: 139 mEq/L (ref 135–145)
Total Bilirubin: 0.4 mg/dL (ref 0.2–1.2)
Total Protein: 6.3 g/dL (ref 6.0–8.3)

## 2022-02-12 LAB — LIPID PANEL
Cholesterol: 133 mg/dL (ref 0–200)
HDL: 55.1 mg/dL (ref >39.00)
LDL Cholesterol: 61 mg/dL (ref 0–99)
NonHDL: 78.04
Total CHOL/HDL Ratio: 2
Triglycerides: 86 mg/dL (ref 0.0–149.0)
VLDL: 17.2 mg/dL (ref 0.0–40.0)

## 2022-02-12 LAB — TSH: TSH: 6.84 u[IU]/mL — ABNORMAL HIGH (ref 0.35–5.50)

## 2022-02-12 LAB — T4, FREE: Free T4: 0.72 ng/dL (ref 0.60–1.60)

## 2022-02-12 MED ORDER — LEVOTHYROXINE SODIUM 50 MCG PO TABS: 50.0000 ug | ORAL_TABLET | Freq: Every day | ORAL | 3 refills | Status: DC

## 2022-02-12 NOTE — Progress Notes (Signed)
Subjective:    Patient ID: Sandy Summers, female    DOB: April 02, 1940, 82 y.o.   MRN: [id number]  HPI Pt in for follow up.  Pt states she had cataract last Tuesday. Pt is following eye surgeon instructions.  Some insomnia since surgery. Having to use whole tab but when eye surgeries are done she states will use 1/2 tab q hs prn.   Htn- pt is on lisinopril 5 mg daily and toprol-xl 1 tab po q ha.  Hypothyroid- pt on levothyroxine 50 mcg daily.  High cholesterol- on atorvastatin.    Review of Systems  Constitutional:  Negative for chills, fatigue and fever.  HENT:  Negative for congestion and ear discharge.   Respiratory:  Negative for cough, chest tightness, shortness of breath and wheezing.   Cardiovascular:  Negative for chest pain and palpitations.  Gastrointestinal:  Negative for abdominal pain.  Genitourinary:  Negative for dysuria and flank pain.  Musculoskeletal:  Negative for back pain and myalgias.  Skin:  Negative for rash.       Pt still itching at night. Treated by dermatologist.  Neurological:  Negative for dizziness, seizures, weakness and light-headedness.  Hematological:  Negative for adenopathy.  Psychiatric/Behavioral:  Negative for behavioral problems, decreased concentration and dysphoric mood.     Past Medical History:  Diagnosis Date   Hypertension    Thyroid disease      Social History   Socioeconomic History   Marital status: Married    Spouse name: Not on file   Number of children: Not on file   Years of education: Not on file   Highest education level: Not on file  Occupational History   Not on file  Tobacco Use   Smoking status: Former    Types: Cigarettes    Quit date: 10/06/1995    Years since quitting: 26.3   Smokeless tobacco: Never  Substance and Sexual Activity   Alcohol use: No   Drug use: No   Sexual activity: Not Currently  Other Topics Concern   Not on file  Social History Narrative   Not on file   Social  Determinants of Health   Financial Resource Strain: Low Risk  (04/20/2021)   Overall Financial Resource Strain (CARDIA)    Difficulty of Paying Living Expenses: Not hard at all  Food Insecurity: No Food Insecurity (04/20/2021)   Hunger Vital Sign    Worried About Running Out of Food in the Last Year: Never true    [place] in the Last Year: Never true  Transportation Needs: No Transportation Needs (04/20/2021)   PRAPARE - Hydrologist (Medical): No    Lack of Transportation (Non-Medical): No  Physical Activity: Sufficiently Active (04/20/2021)   Exercise Vital Sign    Days of Exercise per Week: 7 days    Minutes of Exercise per Session: 30 min  Stress: No Stress Concern Present (04/20/2021)   [place]    Feeling of Stress : Not at all  Social Connections: Moderately Integrated (04/20/2021)   Social Connection and Isolation Panel [NHANES]    Frequency of Communication with Friends and Family: More than three times a week    Frequency of Social Gatherings with Friends and Family: More than three times a week    Attends Religious Services: Never    Marine scientist or Organizations: Yes    Attends Music therapist: More than 4  times per year    Marital Status: Married  Human resources officer Violence: Not At Risk (04/20/2021)   Humiliation, Afraid, Rape, and Kick questionnaire    Fear of Current or Ex-Partner: No    Emotionally Abused: No    Physically Abused: No    Sexually Abused: No    Past Surgical History:  Procedure Laterality Date   CORONARY ANGIOPLASTY WITH STENT PLACEMENT  in 2002   HIP PINNING,CANNULATED Left 11/16/2020   Procedure: CANNULATED HIP PINNING;  Surgeon: Georgeanna Meisinger, MD;  Location: [facility];  Service: Orthopedics;  Laterality: Left;  synthes, cannulated screws, hanna table, large c-arm,    can go as early as 2 p.m.    No family history on  file.  Allergies  Allergen Reactions   Alendronate Nausea Only   Amoxicillin-Pot Clavulanate Diarrhea   Denosumab Other (See Comments)    I just didn't like it    Gabapentin Other (See Comments)    Ineffective for sleep "made me crazy"   Hydrocodone-Acetaminophen Other (See Comments)    Current Outpatient Medications on File Prior to Visit  Medication Sig Dispense Refill   aspirin 81 MG chewable tablet Chew 81 mg by mouth daily.       atorvastatin (LIPITOR) 20 MG tablet Take 1 tablet by mouth once daily 30 tablet 0   Cholecalciferol 25 MCG (1000 UT) tablet Take 1,000 Units by mouth daily.     doxycycline (VIBRAMYCIN) 100 MG capsule Take 1 capsule (100 mg total) by mouth 2 (two) times daily. One po bid x 7 days 14 capsule 0   levothyroxine (SYNTHROID) 50 MCG tablet Take 1 tablet by mouth once daily 30 tablet 0   lisinopril (ZESTRIL) 5 MG tablet Take 1 tablet by mouth once daily 90 tablet 0   LORazepam (ATIVAN) 1 MG tablet Take 0.5 tablets (0.5 mg total) by mouth at bedtime. For sleep 15 tablet 0   metoprolol succinate (TOPROL-XL) 50 MG 24 hr tablet Take 1 tablet (50 mg total) by mouth at bedtime. 90 tablet 3   Multiple Vitamins-Minerals (PRESERVISION AREDS 2 PO) Take 1 capsule by mouth daily.     senna-docusate (SENOKOT-S) 8.6-50 MG tablet Take 1 tablet by mouth at bedtime as needed for mild constipation. 30 tablet 0   traZODone (DESYREL) 50 MG tablet TAKE 1/2 TO 1 TABLET BY MOUTH ONCE DAILY AT BEDTIME FOR SLEEP 30 tablet 0   No current facility-administered medications on file prior to visit.    BP (!) 140/60   Pulse 76   Temp 98 F (36.7 C)   Resp 18   Ht 4' 10"$  (1.473 m)   Wt 109 lb 12.8 oz (49.8 kg)   SpO2 99%   BMI 22.95 kg/m        Objective:   Physical Exam  General Mental Status- Alert. General Appearance- Not in acute distress.   Skin General: Color- Normal Color. Moisture- Normal Moisture.  Neck Carotid Arteries- Normal color. Moisture- Normal Moisture.  No carotid bruits. No JVD.  Chest and Lung Exam Auscultation: Breath Sounds:-Normal.  Cardiovascular Auscultation:Rythm- Regular. Murmurs & Other Heart Sounds:Auscultation of the heart reveals- No Murmurs.  Abdomen Inspection:-Inspeection Normal. Palpation/Percussion:Note:No mass. Palpation and Percussion of the abdomen reveal- Non Tender, Non Distended + BS, no rebound or guarding.  Neurologic Cranial Nerve exam:- CN III-XII intact(No nystagmus), symmetric smile. Strength:- 5/5 equal and symmetric strength both upper and lower extremities.       Assessment & Plan:   Patient Instructions  Htn- Better  on recheck. Continue  lisinopril 5 mg daily and toprol-xl 1 tab po q ha.  Hypothyroid- pt on levothyroxine 50 mcg daily. Adjust dosage if needed after review labs.  High cholesterol- on atorvastatin. Recheck level today.  For insomnia continue trazadone.  Follow up 6 months or sooner if needed.   Mackie Pai, PA-C

## 2022-02-12 NOTE — Patient Instructions (Addendum)
Htn- Better on recheck. Continue  lisinopril 5 mg daily and toprol-xl 1 tab po q ha.  Hypothyroid- pt on levothyroxine 50 mcg daily. Adjust dosage if needed after review labs.  High cholesterol- on atorvastatin. Recheck level today.  For insomnia continue trazadone.  Follow up 6 months or sooner if needed.

## 2022-02-12 NOTE — Addendum Note (Signed)
Addended by: Anabel Halon on: 02/12/2022 07:57 PM   Modules accepted: Orders

## 2022-03-03 ENCOUNTER — Other Ambulatory Visit: Payer: Self-pay | Admitting: Medical

## 2022-03-04 ENCOUNTER — Other Ambulatory Visit: Payer: Self-pay | Admitting: Medical

## 2022-03-06 DIAGNOSIS — H268 Other specified cataract: Secondary | ICD-10-CM | POA: Diagnosis not present

## 2022-03-06 DIAGNOSIS — H2511 Age-related nuclear cataract, right eye: Secondary | ICD-10-CM | POA: Diagnosis not present

## 2022-03-06 DIAGNOSIS — H269 Unspecified cataract: Secondary | ICD-10-CM | POA: Diagnosis not present

## 2022-03-26 ENCOUNTER — Other Ambulatory Visit: Payer: Self-pay | Admitting: Medical

## 2022-03-29 ENCOUNTER — Other Ambulatory Visit: Payer: Self-pay | Admitting: Medical

## 2022-04-11 DIAGNOSIS — H524 Presbyopia: Secondary | ICD-10-CM | POA: Diagnosis not present

## 2022-04-13 ENCOUNTER — Telehealth: Payer: Self-pay | Admitting: Medical

## 2022-04-13 NOTE — Telephone Encounter (Signed)
Contacted [name] to schedule their annual wellness visit. Appointment made for 04/30/2022.  Verlee Rossetti; Care Guide Ambulatory Clinical Support [place] l [facility] Direct Dial: [phone]

## 2022-04-28 ENCOUNTER — Other Ambulatory Visit: Payer: Self-pay | Admitting: Medical

## 2022-04-30 ENCOUNTER — Ambulatory Visit (INDEPENDENT_AMBULATORY_CARE_PROVIDER_SITE_OTHER): Payer: Medicare Other | Admitting: *Deleted

## 2022-04-30 VITALS — Ht <= 58 in | Wt 107.0 lb

## 2022-04-30 DIAGNOSIS — Z0000 Encounter for general adult medical examination without abnormal findings: Secondary | ICD-10-CM

## 2022-04-30 NOTE — Progress Notes (Signed)
Subjective:   Sandy Summers is a 82 y.o. female who presents for Medicare Annual (Subsequent) preventive examination.  I connected with  Sandy Summers on 04/30/22 by a audio enabled telemedicine application and verified that I am speaking with the correct person using two identifiers.  Patient Location: Home  Provider Location: Office/Clinic  I discussed the limitations of evaluation and management by telemedicine. The patient expressed understanding and agreed to proceed.   Review of Systems     Cardiac Risk Factors include: advanced age (>54men, >12 women);dyslipidemia;hypertension     Objective:    Today's Vitals   04/30/22 0901  Weight: 107 lb (48.5 kg)  Height: 4\' 10"  (1.473 m)   Body mass index is 22.36 kg/m.     04/30/2022    9:01 AM 09/01/2021   10:48 AM 08/27/2021    7:34 AM 04/20/2021    8:56 AM 11/15/2020   11:44 PM 04/29/2020    4:36 PM 01/19/2017    1:35 PM  Advanced Directives  Does Patient Have a Medical Advance Directive? Yes Yes Yes Yes Yes No No  Type of Estate agent of [place];Living will Healthcare Power of [employer] Power of [employer] Power of [place];Living will Healthcare Power of [place];Living will    Does patient want to make changes to medical advance directive?     No - Patient declined    Copy of Healthcare Power of Attorney in Chart? No - copy requested  No - copy requested No - copy requested     Would patient like information on creating a medical advance directive?      No - Patient declined     Current Medications (verified) Outpatient Encounter Medications as of 04/30/2022  Medication Sig   aspirin 81 MG chewable tablet Chew 81 mg by mouth daily.     atorvastatin (LIPITOR) 20 MG tablet Take 1 tablet by mouth once daily   Cholecalciferol 25 MCG (1000 UT) tablet Take 1,000 Units by mouth daily.   levothyroxine (SYNTHROID) 50 MCG tablet Take 1 tablet (50 mcg total) by mouth daily.    lisinopril (ZESTRIL) 5 MG tablet Take 1 tablet by mouth once daily   metoprolol succinate (TOPROL-XL) 50 MG 24 hr tablet TAKE 1 TABLET BY MOUTH AT BEDTIME   Multiple Vitamins-Minerals (PRESERVISION AREDS 2 PO) Take 1 capsule by mouth daily.   senna-docusate (SENOKOT-S) 8.6-50 MG tablet Take 1 tablet by mouth at bedtime as needed for mild constipation.   traZODone (DESYREL) 50 MG tablet TAKE 1/2 TO 1 (ONE-HALF TO ONE) TABLET BY MOUTH ONCE DAILY AT BEDTIME FOR SLEEP   [DISCONTINUED] doxycycline (VIBRAMYCIN) 100 MG capsule Take 1 capsule (100 mg total) by mouth 2 (two) times daily. One po bid x 7 days   [DISCONTINUED] LORazepam (ATIVAN) 1 MG tablet Take 0.5 tablets (0.5 mg total) by mouth at bedtime. For sleep   No facility-administered encounter medications on file as of 04/30/2022.    Allergies (verified) Alendronate, Amoxicillin-pot clavulanate, Denosumab, Gabapentin, and Hydrocodone-acetaminophen   History: Past Medical History:  Diagnosis Date   Hyperlipidemia    Hypertension    Thyroid disease    Past Surgical History:  Procedure Laterality Date   CORONARY ANGIOPLASTY WITH STENT PLACEMENT  in 2002   HIP PINNING,CANNULATED Left 11/16/2020   Procedure: CANNULATED HIP PINNING;  Surgeon: Ernestina Columbia, MD;  Location: [facility];  Service: Orthopedics;  Laterality: Left;  synthes, cannulated screws, hanna table, large c-arm,    can go as  early as 2 p.m.   History reviewed. No pertinent family history. Social History   Socioeconomic History   Marital status: Married    Spouse name: Not on file   Number of children: Not on file   Years of education: Not on file   Highest education level: Not on file  Occupational History   Not on file  Tobacco Use   Smoking status: Former    Types: Cigarettes    Quit date: 10/06/1995    Years since quitting: 26.5   Smokeless tobacco: Never  Substance and Sexual Activity   Alcohol use: No   Drug use: No   Sexual activity: Not Currently  Other  Topics Concern   Not on file  Social History Narrative   Not on file   Social Determinants of Health   Financial Resource Strain: Low Risk  (04/20/2021)   Overall Financial Resource Strain (CARDIA)    Difficulty of Paying Living Expenses: Not hard at all  Food Insecurity: No Food Insecurity (04/30/2022)   Hunger Vital Sign    Worried About Running Out of Food in the Last Year: Never true    Ran Out of Food in the Last Year: Never true  Transportation Needs: No Transportation Needs (04/30/2022)   PRAPARE - Administrator, Civil Service (Medical): No    Lack of Transportation (Non-Medical): No  Physical Activity: Sufficiently Active (04/20/2021)   Exercise Vital Sign    Days of Exercise per Week: 7 days    Minutes of Exercise per Session: 30 min  Stress: No Stress Concern Present (04/20/2021)   [employer] of Occupational Health - Occupational Stress Questionnaire    Feeling of Stress : Not at all  Social Connections: Moderately Integrated (04/20/2021)   Social Connection and Isolation Panel [NHANES]    Frequency of Communication with Friends and Family: More than three times a week    Frequency of Social Gatherings with Friends and Family: More than three times a week    Attends Religious Services: Never    Database administrator or Organizations: Yes    Attends Engineer, structural: More than 4 times per year    Marital Status: Married    Tobacco Counseling Counseling given: Not Answered   Clinical Intake:  Pre-visit preparation completed: Yes  Pain : No/denies pain  BMI - recorded: 22.36 Nutritional Status: BMI of 19-24  Normal Nutritional Risks: None Diabetes: No  How often do you need to have someone help you when you read instructions, pamphlets, or other written materials from your doctor or pharmacy?: 1 - Never  Activities of Daily Living    04/30/2022    9:07 AM  In your present state of health, do you have any difficulty performing  the following activities:  Hearing? 0  Vision? 0  Difficulty concentrating or making decisions? 0  Walking or climbing stairs? 0  Dressing or bathing? 0  Doing errands, shopping? 0  Preparing Food and eating ? N  Using the Toilet? N  In the past six months, have you accidently leaked urine? N  Do you have problems with loss of bowel control? N  Managing your Medications? N  Managing your Finances? N  Housekeeping or managing your Housekeeping? N    Patient Care Team: Saguier, Kateri Mc as PCP - General (Internal Medicine) Clark-Burning, Victorino Dike, PA-C (Inactive) (Dermatology)  Indicate any recent Medical Services you may have received from other than [facility] providers in the past year (date may  be approximate).     Assessment:   This is a routine wellness examination for [facility].  Hearing/Vision screen No results found.  Dietary issues and exercise activities discussed: Current Exercise Habits: Home exercise routine, Type of exercise: walking, Time (Minutes): 60, Frequency (Times/Week): 7, Weekly Exercise (Minutes/Week): 420, Intensity: Mild, Exercise limited by: None identified   Goals Addressed   None    Depression Screen    04/30/2022    9:06 AM 08/08/2021    8:00 AM 04/20/2021    8:51 AM 01/23/2021   10:11 AM  PHQ 2/9 Scores  PHQ - 2 Score 0 0 0 0    Fall Risk    04/30/2022    9:03 AM 02/12/2022    8:03 AM 08/08/2021    8:00 AM 04/20/2021    8:57 AM  Fall Risk   Falls in the past year? 0 0 1 1  Number falls in past yr: 0 0 0 0  Injury with Fall? 0 0 1 1  Risk for fall due to : No Fall Risks No Fall Risks History of fall(s);Impaired balance/gait History of fall(s);Impaired balance/gait  Follow up Falls evaluation completed Falls evaluation completed;Education provided Falls evaluation completed Falls prevention discussed    FALL RISK PREVENTION PERTAINING TO THE HOME:  Any stairs in or around the home? Yes  If so, are there any without handrails? No  Home free  of loose throw rugs in walkways, pet beds, electrical cords, etc? Yes  Adequate lighting in your home to reduce risk of falls? Yes   ASSISTIVE DEVICES UTILIZED TO PREVENT FALLS:  Life alert? No  Use of a cane, walker or w/c? No  Grab bars in the bathroom? Yes  Shower chair or bench in shower? No  Elevated toilet seat or a handicapped toilet? No   TIMED UP AND GO:  Was the test performed?  No, audio visit .    Cognitive Function:        04/30/2022    9:12 AM 04/20/2021    9:00 AM  6CIT Screen  What Year? 0 points 0 points  What month? 0 points 0 points  What time? 0 points 0 points  Count back from 20 0 points 0 points  Months in reverse 0 points 0 points  Repeat phrase 0 points 0 points  Total Score 0 points 0 points    Immunizations Immunization History  Administered Date(s) Administered   Influenza, High Dose Seasonal PF 10/09/2014, 09/29/2018   Influenza-Unspecified 09/28/2020, 10/20/2021   PFIZER(Purple Top)SARS-COV-2 Vaccination 02/06/2019, 03/03/2019   Pneumococcal Conjugate-13 04/09/2013   Pneumococcal Polysaccharide-23 01/23/2006   Tdap 07/15/2020    TDAP status: Up to date  Flu Vaccine status: Up to date  Pneumococcal vaccine status: Up to date  Covid-19 vaccine status: Information provided on how to obtain vaccines.   Qualifies for Shingles Vaccine? Yes   Zostavax completed No   Shingrix Completed?: No.    Education has been provided regarding the importance of this vaccine. Patient has been advised to call insurance company to determine out of pocket expense if they have not yet received this vaccine. Advised may also receive vaccine at local pharmacy or Health Dept. Verbalized acceptance and understanding.  Screening Tests Health Maintenance  Topic Date Due   Zoster Vaccines- Shingrix (1 of 2) Never done   COVID-19 Vaccine (3 - 2023-24 season) 09/01/2021   Medicare Annual Wellness (AWV)  04/21/2022   INFLUENZA VACCINE  08/02/2022   DTaP/Tdap/Td  (2 - Td or  Tdap) 07/16/2030   Pneumonia Vaccine 58+ Years old  Completed   DEXA SCAN  Completed   HPV VACCINES  Aged Out    Health Maintenance  Health Maintenance Due  Topic Date Due   Zoster Vaccines- Shingrix (1 of 2) Never done   COVID-19 Vaccine (3 - 2023-24 season) 09/01/2021   Medicare Annual Wellness (AWV)  04/21/2022    Colorectal cancer screening: No longer required.   Mammogram status: Completed 09/01/21. Repeat every year  Bone Density status: Completed 11/07/21. Results reflect: Bone density results: OSTEOPOROSIS. Repeat every 2 years.  Lung Cancer Screening: (Low Dose CT Chest recommended if Age 60-80 years, 30 pack-year currently smoking OR have quit w/in 15years.) does not qualify.   Additional Screening:  Hepatitis C Screening: does not qualify  Vision Screening: Recommended annual ophthalmology exams for early detection of glaucoma and other disorders of the eye. Is the patient up to date with their annual eye exam?  Yes  Who is the provider or what is the name of the office in which the patient attends annual eye exams? Dr. Randon Goldsmith If pt is not established with a provider, would they like to be referred to a provider to establish care? No .   Dental Screening: Recommended annual dental exams for proper oral hygiene  Community Resource Referral / Chronic Care Management: CRR required this visit?  No   CCM required this visit?  No      Plan:     I have personally reviewed and noted the following in the patient's chart:   Medical and social history Use of alcohol, tobacco or illicit drugs  Current medications and supplements including opioid prescriptions. Patient is not currently taking opioid prescriptions. Functional ability and status Nutritional status Physical activity Advanced directives List of other physicians Hospitalizations, surgeries, and ER visits in previous 12 months Vitals Screenings to include cognitive, depression, and  falls Referrals and appointments  In addition, I have reviewed and discussed with patient certain preventive protocols, quality metrics, and best practice recommendations. A written personalized care plan for preventive services as well as general preventive health recommendations were provided to patient.   Due to this being a telephonic visit, the after visit summary with patients personalized plan was offered to patient via mail or my-chart.  Per request, patient was mailed a copy of AVS.  Donne Anon, CMA   04/30/2022   Nurse Notes: None

## 2022-04-30 NOTE — Patient Instructions (Signed)
Sandy Summers , Thank you for taking time to come for your Medicare Wellness Visit. I appreciate your ongoing commitment to your health goals. Please review the following plan we discussed and let me know if I can assist you in the future.     This is a list of the screening recommended for you and due dates:  Health Maintenance  Topic Date Due   Zoster (Shingles) Vaccine (1 of 2) Never done   COVID-19 Vaccine (3 - 2023-24 season) 09/01/2021   Flu Shot  08/02/2022   Medicare Annual Wellness Visit  04/30/2023   DTaP/Tdap/Td vaccine (2 - Td or Tdap) 07/16/2030   Pneumonia Vaccine  Completed   DEXA scan (bone density measurement)  Completed   HPV Vaccine  Aged Out    Next appointment: Follow up in one year for your annual wellness visit.   Preventive Care 37 Years and Older, Female Preventive care refers to lifestyle choices and visits with your health care provider that can promote health and wellness. What does preventive care include? A yearly physical exam. This is also called an annual well check. Dental exams once or twice a year. Routine eye exams. Ask your health care provider how often you should have your eyes checked. Personal lifestyle choices, including: Daily care of your teeth and gums. Regular physical activity. Eating a healthy diet. Avoiding tobacco and drug use. Limiting alcohol use. Practicing safe sex. Taking low-dose aspirin every day. Taking vitamin and mineral supplements as recommended by your health care provider. What happens during an annual well check? The services and screenings done by your health care provider during your annual well check will depend on your age, overall health, lifestyle risk factors, and family history of disease. Counseling  Your health care provider may ask you questions about your: Alcohol use. Tobacco use. Drug use. Emotional well-being. Home and relationship well-being. Sexual activity. Eating habits. History of  falls. Memory and ability to understand (cognition). Work and work Astronomer. Reproductive health. Screening  You may have the following tests or measurements: Height, weight, and BMI. Blood pressure. Lipid and cholesterol levels. These may be checked every 5 years, or more frequently if you are over 31 years old. Skin check. Lung cancer screening. You may have this screening every year starting at age 2 if you have a 30-pack-year history of smoking and currently smoke or have quit within the past 15 years. Fecal occult blood test (FOBT) of the stool. You may have this test every year starting at age 88. Flexible sigmoidoscopy or colonoscopy. You may have a sigmoidoscopy every 5 years or a colonoscopy every 10 years starting at age 96. Hepatitis C blood test. Hepatitis B blood test. Sexually transmitted disease (STD) testing. Diabetes screening. This is done by checking your blood sugar (glucose) after you have not eaten for a while (fasting). You may have this done every 1-3 years. Bone density scan. This is done to screen for osteoporosis. You may have this done starting at age 85. Mammogram. This may be done every 1-2 years. Talk to your health care provider about how often you should have regular mammograms. Talk with your health care provider about your test results, treatment options, and if necessary, the need for more tests. Vaccines  Your health care provider may recommend certain vaccines, such as: Influenza vaccine. This is recommended every year. Tetanus, diphtheria, and acellular pertussis (Tdap, Td) vaccine. You may need a Td booster every 10 years. Zoster vaccine. You may need this after  age 72. Pneumococcal 13-valent conjugate (PCV13) vaccine. One dose is recommended after age 70. Pneumococcal polysaccharide (PPSV23) vaccine. One dose is recommended after age 46. Talk to your health care provider about which screenings and vaccines you need and how often you need  them. This information is not intended to replace advice given to you by your health care provider. Make sure you discuss any questions you have with your health care provider. Document Released: 01/14/2015 Document Revised: 09/07/2015 Document Reviewed: 10/19/2014 Elsevier Interactive Patient Education  2017 [place] Prevention in the Home Falls can cause injuries. They can happen to people of all ages. There are many things you can do to make your home safe and to help prevent falls. What can I do on the outside of my home? Regularly fix the edges of walkways and driveways and fix any cracks. Remove anything that might make you trip as you walk through a door, such as a raised step or threshold. Trim any bushes or trees on the path to your home. Use bright outdoor lighting. Clear any walking paths of anything that might make someone trip, such as rocks or tools. Regularly check to see if handrails are loose or broken. Make sure that both sides of any steps have handrails. Any raised decks and porches should have guardrails on the edges. Have any leaves, snow, or ice cleared regularly. Use sand or salt on walking paths during winter. Clean up any spills in your garage right away. This includes oil or grease spills. What can I do in the bathroom? Use night lights. Install grab bars by the toilet and in the tub and shower. Do not use towel bars as grab bars. Use non-skid mats or decals in the tub or shower. If you need to sit down in the shower, use a plastic, non-slip stool. Keep the floor dry. Clean up any water that spills on the floor as soon as it happens. Remove soap buildup in the tub or shower regularly. Attach bath mats securely with double-sided non-slip rug tape. Do not have throw rugs and other things on the floor that can make you trip. What can I do in the bedroom? Use night lights. Make sure that you have a light by your bed that is easy to reach. Do not use  any sheets or blankets that are too big for your bed. They should not hang down onto the floor. Have a firm chair that has side arms. You can use this for support while you get dressed. Do not have throw rugs and other things on the floor that can make you trip. What can I do in the kitchen? Clean up any spills right away. Avoid walking on wet floors. Keep items that you use a lot in easy-to-reach places. If you need to reach something above you, use a strong step stool that has a grab bar. Keep electrical cords out of the way. Do not use floor polish or wax that makes floors slippery. If you must use wax, use non-skid floor wax. Do not have throw rugs and other things on the floor that can make you trip. What can I do with my stairs? Do not leave any items on the stairs. Make sure that there are handrails on both sides of the stairs and use them. Fix handrails that are broken or loose. Make sure that handrails are as long as the stairways. Check any carpeting to make sure that it is firmly attached to the stairs.  Fix any carpet that is loose or worn. Avoid having throw rugs at the top or bottom of the stairs. If you do have throw rugs, attach them to the floor with carpet tape. Make sure that you have a light switch at the top of the stairs and the bottom of the stairs. If you do not have them, ask someone to add them for you. What else can I do to help prevent falls? Wear shoes that: Do not have high heels. Have rubber bottoms. Are comfortable and fit you well. Are closed at the toe. Do not wear sandals. If you use a stepladder: Make sure that it is fully opened. Do not climb a closed stepladder. Make sure that both sides of the stepladder are locked into place. Ask someone to hold it for you, if possible. Clearly mark and make sure that you can see: Any grab bars or handrails. First and last steps. Where the edge of each step is. Use tools that help you move around (mobility aids)  if they are needed. These include: Canes. Walkers. Scooters. Crutches. Turn on the lights when you go into a dark area. Replace any light bulbs as soon as they burn out. Set up your furniture so you have a clear path. Avoid moving your furniture around. If any of your floors are uneven, fix them. If there are any pets around you, be aware of where they are. Review your medicines with your doctor. Some medicines can make you feel dizzy. This can increase your chance of falling. Ask your doctor what other things that you can do to help prevent falls. This information is not intended to replace advice given to you by your health care provider. Make sure you discuss any questions you have with your health care provider. Document Released: 10/14/2008 Document Revised: 05/26/2015 Document Reviewed: 01/22/2014 Elsevier Interactive Patient Education  2017 [employer].

## 2022-05-09 DIAGNOSIS — I2510 Atherosclerotic heart disease of native coronary artery without angina pectoris: Secondary | ICD-10-CM | POA: Diagnosis not present

## 2022-05-09 DIAGNOSIS — I350 Nonrheumatic aortic (valve) stenosis: Secondary | ICD-10-CM | POA: Diagnosis not present

## 2022-05-09 DIAGNOSIS — I10 Essential (primary) hypertension: Secondary | ICD-10-CM | POA: Diagnosis not present

## 2022-05-09 DIAGNOSIS — I6522 Occlusion and stenosis of left carotid artery: Secondary | ICD-10-CM | POA: Diagnosis not present

## 2022-05-14 DIAGNOSIS — I2510 Atherosclerotic heart disease of native coronary artery without angina pectoris: Secondary | ICD-10-CM | POA: Diagnosis not present

## 2022-05-14 DIAGNOSIS — I350 Nonrheumatic aortic (valve) stenosis: Secondary | ICD-10-CM | POA: Diagnosis not present

## 2022-05-26 ENCOUNTER — Other Ambulatory Visit: Payer: Self-pay | Admitting: Medical

## 2022-06-21 ENCOUNTER — Other Ambulatory Visit: Payer: Self-pay | Admitting: Medical

## 2022-06-23 ENCOUNTER — Other Ambulatory Visit: Payer: Self-pay | Admitting: Medical

## 2022-07-21 ENCOUNTER — Other Ambulatory Visit: Payer: Self-pay | Admitting: Medical

## 2022-07-25 ENCOUNTER — Other Ambulatory Visit: Payer: Self-pay | Admitting: Medical

## 2022-08-17 ENCOUNTER — Other Ambulatory Visit: Payer: Self-pay | Admitting: Medical

## 2022-08-17 NOTE — Telephone Encounter (Signed)
Medication: Trazodone 50 mg  Directions: Take half tablet to 1 tablet by mouth once a day at bedtime  Last given: 07/21/22 Number refills: 0 Last o/v: 02/12/22 Follow up: 6 months-not scheduled  Labs:

## 2022-08-18 NOTE — Telephone Encounter (Signed)
Refilled the rx but looks like late for follow up. Please get her scheduled.

## 2022-08-25 ENCOUNTER — Other Ambulatory Visit: Payer: Self-pay | Admitting: Medical

## 2022-09-13 ENCOUNTER — Other Ambulatory Visit: Payer: Self-pay | Admitting: Medical

## 2022-09-14 DIAGNOSIS — Z1231 Encounter for screening mammogram for malignant neoplasm of breast: Secondary | ICD-10-CM | POA: Diagnosis not present

## 2022-09-14 LAB — HM MAMMOGRAPHY

## 2022-09-16 ENCOUNTER — Other Ambulatory Visit: Payer: Self-pay | Admitting: Medical

## 2022-09-17 ENCOUNTER — Ambulatory Visit (INDEPENDENT_AMBULATORY_CARE_PROVIDER_SITE_OTHER): Payer: Medicare Other | Admitting: Medical

## 2022-09-17 ENCOUNTER — Encounter: Payer: Self-pay | Admitting: Medical

## 2022-09-17 VITALS — BP 138/70 | HR 72 | Temp 98.3°F | Resp 18 | Ht <= 58 in | Wt 109.0 lb

## 2022-09-17 DIAGNOSIS — E039 Hypothyroidism, unspecified: Secondary | ICD-10-CM

## 2022-09-17 DIAGNOSIS — E785 Hyperlipidemia, unspecified: Secondary | ICD-10-CM

## 2022-09-17 DIAGNOSIS — R739 Hyperglycemia, unspecified: Secondary | ICD-10-CM

## 2022-09-17 DIAGNOSIS — M5432 Sciatica, left side: Secondary | ICD-10-CM | POA: Diagnosis not present

## 2022-09-17 DIAGNOSIS — I10 Essential (primary) hypertension: Secondary | ICD-10-CM

## 2022-09-17 DIAGNOSIS — G4700 Insomnia, unspecified: Secondary | ICD-10-CM

## 2022-09-17 LAB — COMPREHENSIVE METABOLIC PANEL
ALT: 25 U/L (ref 0–35)
AST: 29 U/L (ref 0–37)
Albumin: 3.9 g/dL (ref 3.5–5.2)
Alkaline Phosphatase: 64 U/L (ref 39–117)
BUN: 21 mg/dL (ref 6–23)
CO2: 24 meq/L (ref 19–32)
Calcium: 9.3 mg/dL (ref 8.4–10.5)
Chloride: 104 mEq/L (ref 96–112)
Creatinine, Ser: 0.91 mg/dL (ref 0.40–1.20)
GFR: 59.1 mL/min — ABNORMAL LOW (ref >60.00)
Glucose, Bld: 85 mg/dL (ref 70–99)
Potassium: 4.5 meq/L (ref 3.5–5.1)
Sodium: 138 meq/L (ref 135–145)
Total Bilirubin: 0.4 mg/dL (ref 0.2–1.2)
Total Protein: 6.4 g/dL (ref 6.0–8.3)

## 2022-09-17 LAB — T4, FREE: Free T4: 0.76 ng/dL (ref 0.60–1.60)

## 2022-09-17 LAB — HEMOGLOBIN A1C: Hgb A1c MFr Bld: 5.7 % (ref 4.6–6.5)

## 2022-09-17 LAB — TSH: TSH: 3.45 u[IU]/mL (ref 0.35–5.50)

## 2022-09-17 MED ORDER — METHYLPREDNISOLONE 4 MG PO TABS: ORAL_TABLET | ORAL | 0 refills | Status: DC

## 2022-09-17 NOTE — Patient Instructions (Addendum)
1. Elevated blood sugar -cmp and A1c today. Recommend low sugar diet.  2. Hypothyroidism, unspecified type -on levothyroxines.  Last time tsh was mild high. Want to recheck labs  today. - TSH - T4, free  3. Insomnia, unspecified type -much improved with trazadone  4. Dyslipidemia -continue atrovastatin  5. Sciatica of left side  -rx low dose taper 4 day medrol. Rx advisement given. - can use pillow between leg when sleeping. -can add on salon pas lidocaine patch. -try rest from walking or cut distance down at least. -update me in 5 days. If pain persist refer to ortho or sports med.  6. Htn- bp well controlled.  Follow up date to be determined after lab review.

## 2022-09-17 NOTE — Progress Notes (Signed)
Subjective:    Patient ID: Sandy Summers, female    DOB: 1940/01/20, 82 y.o.   MRN: [id number]  HPI  Pt in for follow up.   On last visit discussed below issues.   "Htn- Better on recheck. Continue  lisinopril 5 mg daily and toprol-xl 1 tab po q ha.   Hypothyroid- pt on levothyroxine 50 mcg daily. Adjust dosage if needed after review labs.   High cholesterol- on atorvastatin. Recheck level today.   For insomnia continue trazadone."   Bp well controlled on lisnopril mg daily and toprol xl 1 tab daily/  Hypothyroid- pt on levothyroxine 50 mcg daily. Last lab tsh mild high but t4 was in normal range. Advised to maintain same dose.   High cholesterol- on atorvastatin.   Pt update me that trazadone works very well for her insomnia. Can take 1/2 tab of trazadone on some days. Other days second half if wakes up and can't fall asleep.   She does update me that left si  is starting to hurts. She is walking 2-5 miles a day most days.  Pt had some xray of left hip 11-16-2020  IMPRESSION: 1. Well-positioned left subcapital femoral neck fracture following ORIF.  Pt states after hip surgery in past. She states for 2 years she had some left si arrea pain but not enough to stop her from walking.  Now pain is more in left si area for months. Pt states faint pain when walking the after wards pain will be moderate after walking. Then by next morning pain flares down. But last 3 days left si area worse.     Review of Systems  Constitutional:  Negative for chills, fatigue and fever.  HENT:  Negative for congestion and ear discharge.   Respiratory:  Negative for cough, chest tightness, shortness of breath and wheezing.   Cardiovascular:  Negative for chest pain and palpitations.  Gastrointestinal:  Negative for abdominal pain.  Genitourinary:  Negative for dysuria and flank pain.  Musculoskeletal:  Negative for back pain and myalgias.       Left si area pain.   Skin:  Negative for  rash.       Pt still itching at night. Treated by dermatologist.  Neurological:  Negative for dizziness, seizures, weakness and light-headedness.  Hematological:  Negative for adenopathy.  Psychiatric/Behavioral:  Negative for behavioral problems, decreased concentration, dysphoric mood and suicidal ideas. The patient is not nervous/anxious.     Past Medical History:  Diagnosis Date   Hyperlipidemia    Hypertension    Thyroid disease      Social History   Socioeconomic History   Marital status: Married    Spouse name: Not on file   Number of children: Not on file   Years of education: Not on file   Highest education level: Not on file  Occupational History   Not on file  Tobacco Use   Smoking status: Former    Current packs/day: 0.00    Types: Cigarettes    Quit date: 10/06/1995    Years since quitting: 26.9   Smokeless tobacco: Never  Substance and Sexual Activity   Alcohol use: No   Drug use: No   Sexual activity: Not Currently  Other Topics Concern   Not on file  Social History Narrative   Not on file   Social Determinants of Health   Financial Resource Strain: Low Risk  (04/20/2021)   Overall Financial Resource Strain (CARDIA)    Difficulty of  Paying Living Expenses: Not hard at all  Food Insecurity: No Food Insecurity (04/30/2022)   Hunger Vital Sign    Worried About Running Out of Food in the Last Year: Never true    Ran Out of Food in the Last Year: Never true  Transportation Needs: No Transportation Needs (04/30/2022)   PRAPARE - Administrator, Civil Service (Medical): No    Lack of Transportation (Non-Medical): No  Physical Activity: Sufficiently Active (04/20/2021)   Exercise Vital Sign    Days of Exercise per Week: 7 days    Minutes of Exercise per Session: 30 min  Stress: No Stress Concern Present (04/20/2021)   [employer] of Occupational Health - Occupational Stress Questionnaire    Feeling of Stress : Not at all  Social  Connections: Moderately Integrated (04/20/2021)   Social Connection and Isolation Panel [NHANES]    Frequency of Communication with Friends and Family: More than three times a week    Frequency of Social Gatherings with Friends and Family: More than three times a week    Attends Religious Services: Never    Database administrator or Organizations: Yes    Attends Engineer, structural: More than 4 times per year    Marital Status: Married  Catering manager Violence: Not At Risk (04/30/2022)   Humiliation, Afraid, Rape, and Kick questionnaire    Fear of Current or Ex-Partner: No    Emotionally Abused: No    Physically Abused: No    Sexually Abused: No    Past Surgical History:  Procedure Laterality Date   CORONARY ANGIOPLASTY WITH STENT PLACEMENT  in 2002   HIP PINNING,CANNULATED Left 11/16/2020   Procedure: CANNULATED HIP PINNING;  Surgeon: Ernestina Columbia, MD;  Location: [facility];  Service: Orthopedics;  Laterality: Left;  synthes, cannulated screws, hanna table, large c-arm,    can go as early as 2 p.m.    No family history on file.  Allergies  Allergen Reactions   Alendronate Nausea Only   Amoxicillin-Pot Clavulanate Diarrhea   Denosumab Other (See Comments)    I just didn't like it    Gabapentin Other (See Comments)    Ineffective for sleep "made me crazy"   Hydrocodone-Acetaminophen Other (See Comments)    Current Outpatient Medications on File Prior to Visit  Medication Sig Dispense Refill   aspirin 81 MG chewable tablet Chew 81 mg by mouth daily.       atorvastatin (LIPITOR) 20 MG tablet Take 1 tablet (20 mg total) by mouth daily. 30 tablet 0   Cholecalciferol 25 MCG (1000 UT) tablet Take 1,000 Units by mouth daily.     levothyroxine (SYNTHROID) 50 MCG tablet Take 1 tablet (50 mcg total) by mouth daily. 90 tablet 3   lisinopril (ZESTRIL) 5 MG tablet Take 1 tablet (5 mg total) by mouth daily. 90 tablet 0   metoprolol succinate (TOPROL-XL) 50 MG 24 hr tablet TAKE 1  TABLET BY MOUTH AT BEDTIME 90 tablet 0   Multiple Vitamins-Minerals (PRESERVISION AREDS 2 PO) Take 1 capsule by mouth daily.     senna-docusate (SENOKOT-S) 8.6-50 MG tablet Take 1 tablet by mouth at bedtime as needed for mild constipation. 30 tablet 0   traZODone (DESYREL) 50 MG tablet TAKE 1/2 TO 1 (ONE-HALF TO ONE) TABLET BY MOUTH ONCE DAILY AT BEDTIME 30 tablet 0   No current facility-administered medications on file prior to visit.    BP 138/70 (BP Location: Left Arm, Patient Position: Sitting, Cuff Size:  Normal)   Pulse 72   Temp 98.3 F (36.8 C) (Oral)   Resp 18   Ht 4\' 10"  (1.473 m)   Wt 109 lb (49.4 kg)   SpO2 97%   BMI 22.78 kg/m        Objective:   Physical Exam   General Mental Status- Alert. General Appearance- Not in acute distress.   Skin General: Color- Normal Color. Moisture- Normal Moisture.  Neck Carotid Arteries- Normal color. Moisture- Normal Moisture. No carotid bruits. No JVD.  Chest and Lung Exam Auscultation: Breath Sounds:-Normal.  Cardiovascular Auscultation:Rythm- Regular. Murmurs & Other Heart Sounds:Auscultation of the heart reveals- No Murmurs.  Abdomen Inspection:-Inspeection Normal. Palpation/Percussion:Note:No mass. Palpation and Percussion of the abdomen reveal- Non Tender, Non Distended + BS, no rebound or guarding.   Neurologic Cranial Nerve exam:- CN III-XII intact(No nystagmus), symmetric smile. Strength:- 5/5 equal and symmetric strength both upper and lower extremities.    Left hip- good rom. No hip pain on palpation. No crepitus.  Back- no mid lumbar tenderness. To palpation Left si- direct tender to palpation and on changing position patient note pain in this area    Assessment & Plan:   Patient Instructions  1. Elevated blood sugar -cmp and A1c today. Recommend low sugar diet.  2. Hypothyroidism, unspecified type -on levothyroxines.  Last time tsh was mild high. Want to recheck labs  today. - TSH - T4,  free  3. Insomnia, unspecified type -much improved with trazadone  4. Dyslipidemia -continue atrovastatin  5. Sciatica of left side  -rx low dose taper 4 day medrol. Rx advisement given. - can use pillow between leg when sleeping. -can add on salon pas lidocaine patch. -try rest from walking or cut distance down at least. -update me in 5 days. If pain persist refer to ortho or sports med.  6. Htn- bp well controlled.  Follow up date to be determined after lab review.

## 2022-09-21 ENCOUNTER — Telehealth: Payer: Self-pay | Admitting: Medical

## 2022-09-21 NOTE — Telephone Encounter (Signed)
Pt called to let him know that she is doing a little better but she is still having some pain in her butt. Pt would like to know if she needed to be doing something else.

## 2022-09-23 ENCOUNTER — Other Ambulatory Visit: Payer: Self-pay | Admitting: Medical

## 2022-09-24 NOTE — Telephone Encounter (Signed)
Pt would like referral

## 2022-09-25 NOTE — Addendum Note (Signed)
Addended by: Gwenevere Abbot on: 09/25/2022 09:56 PM   Modules accepted: Orders

## 2022-09-27 DIAGNOSIS — M25552 Pain in left hip: Secondary | ICD-10-CM | POA: Diagnosis not present

## 2022-10-12 DIAGNOSIS — R6889 Other general symptoms and signs: Secondary | ICD-10-CM | POA: Diagnosis not present

## 2022-10-12 DIAGNOSIS — M25552 Pain in left hip: Secondary | ICD-10-CM | POA: Diagnosis not present

## 2022-10-12 DIAGNOSIS — Z9181 History of falling: Secondary | ICD-10-CM | POA: Diagnosis not present

## 2022-10-12 DIAGNOSIS — R29898 Other symptoms and signs involving the musculoskeletal system: Secondary | ICD-10-CM | POA: Diagnosis not present

## 2022-10-15 DIAGNOSIS — R6889 Other general symptoms and signs: Secondary | ICD-10-CM | POA: Diagnosis not present

## 2022-10-15 DIAGNOSIS — Z7409 Other reduced mobility: Secondary | ICD-10-CM | POA: Diagnosis not present

## 2022-10-15 DIAGNOSIS — M25552 Pain in left hip: Secondary | ICD-10-CM | POA: Diagnosis not present

## 2022-10-15 DIAGNOSIS — R262 Difficulty in walking, not elsewhere classified: Secondary | ICD-10-CM | POA: Diagnosis not present

## 2022-10-21 ENCOUNTER — Other Ambulatory Visit: Payer: Self-pay | Admitting: Medical

## 2022-10-22 ENCOUNTER — Other Ambulatory Visit: Payer: Self-pay | Admitting: Medical

## 2022-10-25 DIAGNOSIS — M25552 Pain in left hip: Secondary | ICD-10-CM | POA: Diagnosis not present

## 2022-10-25 DIAGNOSIS — R6889 Other general symptoms and signs: Secondary | ICD-10-CM | POA: Diagnosis not present

## 2022-10-25 DIAGNOSIS — Z7409 Other reduced mobility: Secondary | ICD-10-CM | POA: Diagnosis not present

## 2022-10-25 DIAGNOSIS — R262 Difficulty in walking, not elsewhere classified: Secondary | ICD-10-CM | POA: Diagnosis not present

## 2022-10-31 DIAGNOSIS — Z7409 Other reduced mobility: Secondary | ICD-10-CM | POA: Diagnosis not present

## 2022-10-31 DIAGNOSIS — M25552 Pain in left hip: Secondary | ICD-10-CM | POA: Diagnosis not present

## 2022-10-31 DIAGNOSIS — R6889 Other general symptoms and signs: Secondary | ICD-10-CM | POA: Diagnosis not present

## 2022-10-31 DIAGNOSIS — R262 Difficulty in walking, not elsewhere classified: Secondary | ICD-10-CM | POA: Diagnosis not present

## 2022-11-06 DIAGNOSIS — M25552 Pain in left hip: Secondary | ICD-10-CM | POA: Diagnosis not present

## 2022-11-06 DIAGNOSIS — M545 Low back pain, unspecified: Secondary | ICD-10-CM | POA: Diagnosis not present

## 2022-11-07 ENCOUNTER — Other Ambulatory Visit (HOSPITAL_BASED_OUTPATIENT_CLINIC_OR_DEPARTMENT_OTHER): Payer: Self-pay | Admitting: Orthopedic Surgery

## 2022-11-07 DIAGNOSIS — M25552 Pain in left hip: Secondary | ICD-10-CM | POA: Diagnosis not present

## 2022-11-07 DIAGNOSIS — R6889 Other general symptoms and signs: Secondary | ICD-10-CM | POA: Diagnosis not present

## 2022-11-07 DIAGNOSIS — R262 Difficulty in walking, not elsewhere classified: Secondary | ICD-10-CM | POA: Diagnosis not present

## 2022-11-07 DIAGNOSIS — Z7409 Other reduced mobility: Secondary | ICD-10-CM | POA: Diagnosis not present

## 2022-11-07 DIAGNOSIS — M545 Low back pain, unspecified: Secondary | ICD-10-CM

## 2022-11-11 ENCOUNTER — Ambulatory Visit (HOSPITAL_BASED_OUTPATIENT_CLINIC_OR_DEPARTMENT_OTHER)
Admission: RE | Admit: 2022-11-11 | Discharge: 2022-11-11 | Disposition: A | Discharge status: Home / Self Care | Payer: Medicare Other | Source: Ambulatory Visit | Attending: Orthopedic Surgery | Admitting: Orthopedic Surgery

## 2022-11-11 DIAGNOSIS — M545 Low back pain, unspecified: Secondary | ICD-10-CM | POA: Insufficient documentation

## 2022-11-11 DIAGNOSIS — M47816 Spondylosis without myelopathy or radiculopathy, lumbar region: Secondary | ICD-10-CM | POA: Diagnosis not present

## 2022-11-11 DIAGNOSIS — M47817 Spondylosis without myelopathy or radiculopathy, lumbosacral region: Secondary | ICD-10-CM | POA: Diagnosis not present

## 2022-11-11 DIAGNOSIS — M48061 Spinal stenosis, lumbar region without neurogenic claudication: Secondary | ICD-10-CM | POA: Diagnosis not present

## 2022-11-11 DIAGNOSIS — M5126 Other intervertebral disc displacement, lumbar region: Secondary | ICD-10-CM | POA: Diagnosis not present

## 2022-11-15 DIAGNOSIS — Z7409 Other reduced mobility: Secondary | ICD-10-CM | POA: Diagnosis not present

## 2022-11-15 DIAGNOSIS — M25552 Pain in left hip: Secondary | ICD-10-CM | POA: Diagnosis not present

## 2022-11-15 DIAGNOSIS — R6889 Other general symptoms and signs: Secondary | ICD-10-CM | POA: Diagnosis not present

## 2022-11-15 DIAGNOSIS — R262 Difficulty in walking, not elsewhere classified: Secondary | ICD-10-CM | POA: Diagnosis not present

## 2022-11-18 ENCOUNTER — Other Ambulatory Visit: Payer: Self-pay | Admitting: Medical

## 2022-11-20 ENCOUNTER — Other Ambulatory Visit: Payer: Self-pay | Admitting: Medical

## 2022-11-20 DIAGNOSIS — M545 Low back pain, unspecified: Secondary | ICD-10-CM | POA: Diagnosis not present

## 2022-11-22 DIAGNOSIS — Z9181 History of falling: Secondary | ICD-10-CM | POA: Diagnosis not present

## 2022-11-22 DIAGNOSIS — M25552 Pain in left hip: Secondary | ICD-10-CM | POA: Diagnosis not present

## 2022-11-22 DIAGNOSIS — Z7409 Other reduced mobility: Secondary | ICD-10-CM | POA: Diagnosis not present

## 2022-11-22 DIAGNOSIS — R262 Difficulty in walking, not elsewhere classified: Secondary | ICD-10-CM | POA: Diagnosis not present

## 2022-12-03 DIAGNOSIS — M533 Sacrococcygeal disorders, not elsewhere classified: Secondary | ICD-10-CM | POA: Diagnosis not present

## 2022-12-03 DIAGNOSIS — M5416 Radiculopathy, lumbar region: Secondary | ICD-10-CM | POA: Diagnosis not present

## 2022-12-07 ENCOUNTER — Telehealth: Payer: Self-pay | Admitting: Medical

## 2022-12-07 NOTE — Telephone Encounter (Signed)
Pharmacy would like approval for a manufacturer change for levothyroxine     [facility] [address] [place], [place] - [address], [place] [address] Phone: [phone]  Fax: [phone]

## 2022-12-07 NOTE — Telephone Encounter (Signed)
Spoke with Candace at [facility] and advised her that change is ok

## 2022-12-16 ENCOUNTER — Other Ambulatory Visit: Payer: Self-pay | Admitting: Medical

## 2022-12-17 ENCOUNTER — Other Ambulatory Visit: Payer: Self-pay | Admitting: Medical

## 2023-01-09 DIAGNOSIS — M533 Sacrococcygeal disorders, not elsewhere classified: Secondary | ICD-10-CM | POA: Diagnosis not present

## 2023-01-13 ENCOUNTER — Other Ambulatory Visit: Payer: Self-pay | Admitting: Medical

## 2023-01-18 ENCOUNTER — Other Ambulatory Visit: Payer: Self-pay | Admitting: Medical

## 2023-01-21 ENCOUNTER — Other Ambulatory Visit: Payer: Self-pay | Admitting: Medical

## 2023-01-28 ENCOUNTER — Ambulatory Visit (INDEPENDENT_AMBULATORY_CARE_PROVIDER_SITE_OTHER): Payer: Medicare Other | Admitting: Physician Assistant

## 2023-01-28 VITALS — BP 160/88 | HR 87 | Temp 98.4°F | Ht <= 58 in | Wt 112.4 lb

## 2023-01-28 DIAGNOSIS — M542 Cervicalgia: Secondary | ICD-10-CM

## 2023-01-28 MED ORDER — CELECOXIB 100 MG PO CAPS: 100.0000 mg | ORAL_CAPSULE | Freq: Two times a day (BID) | ORAL | 0 refills | Status: AC

## 2023-01-28 MED ORDER — CYCLOBENZAPRINE HCL 5 MG PO TABS: 5.0000 mg | ORAL_TABLET | Freq: Every day | ORAL | 0 refills | Status: AC

## 2023-01-28 NOTE — Progress Notes (Unsigned)
Established patient visit   Patient: Sandy Summers   DOB: 07/14/1940   83 y.o. Female  MRN: [id number] Visit Date: 01/28/2023  Today's healthcare provider: Alfredia Ferguson, PA-C   Chief Complaint  Patient presents with   Neck Pain    2-3 days neck pain and stiffness- stabbing pain when she moves a certain way, not throbbing. Causing patient less sleep. OTC- none, tries not taking tylenol.   Subjective    Pt reports 2-3 days of right sided neck pain. She reports she moved her head the wrong way, denies injury, and the bottom/right side of her neck started hurting.  This is interrupting her sleep. She has taken tylenol for symptoms.  Medications: Outpatient Medications Prior to Visit  Medication Sig   aspirin 81 MG chewable tablet Chew 81 mg by mouth daily.     atorvastatin (LIPITOR) 20 MG tablet TAKE 1 TABLET BY MOUTH ONCE DAILY . APPOINTMENT REQUIRED FOR FUTURE REFILLS   Cholecalciferol 25 MCG (1000 UT) tablet Take 1,000 Units by mouth daily.   levothyroxine (SYNTHROID) 50 MCG tablet Take 1 tablet (50 mcg total) by mouth daily.   lisinopril (ZESTRIL) 5 MG tablet Take 1 tablet by mouth once daily   metoprolol succinate (TOPROL-XL) 50 MG 24 hr tablet TAKE 1 TABLET BY MOUTH AT BEDTIME   Multiple Vitamins-Minerals (PRESERVISION AREDS 2 PO) Take 1 capsule by mouth daily.   senna-docusate (SENOKOT-S) 8.6-50 MG tablet Take 1 tablet by mouth at bedtime as needed for mild constipation.   traZODone (DESYREL) 50 MG tablet TAKE 1/2 TO 1 TABLET BY MOUTH DAILY AT BEDTIME   [DISCONTINUED] methylPREDNISolone (MEDROL) 4 MG tablet 4 tab po day 1, 3 tab po day 2, 2 tab po day 3 and 1 tab po day 4   No facility-administered medications prior to visit.    Review of Systems  Constitutional:  Negative for fatigue and fever.  Respiratory:  Negative for cough and shortness of breath.   Cardiovascular:  Negative for chest pain and leg swelling.  Gastrointestinal:  Negative for abdominal pain.   Musculoskeletal:  Positive for neck pain.  Neurological:  Negative for dizziness and headaches.       Objective    BP (!) 160/88   Pulse 87   Temp 98.4 F (36.9 C) (Oral)   Ht 4\' 10"  (1.473 m)   Wt 112 lb 6 oz (51 kg)   SpO2 94%   BMI 23.49 kg/m    Physical Exam Vitals reviewed.  Constitutional:      Appearance: She is not ill-appearing.  HENT:     Head: Normocephalic.  Eyes:     Conjunctiva/sclera: Conjunctivae normal.  Cardiovascular:     Rate and Rhythm: Normal rate.  Pulmonary:     Effort: Pulmonary effort is normal. No respiratory distress.  Musculoskeletal:     Comments: Tenderness and muscle tightness across right trap. No tenderness to c-spine . Full ROM without significant pain  Neurological:     General: No focal deficit present.     Mental Status: She is alert and oriented to person, place, and time.  Psychiatric:        Mood and Affect: Mood normal.        Behavior: Behavior normal.      No results found for any visits on 01/28/23.  Assessment & Plan    Neck pain -     Celecoxib; Take 1 capsule (100 mg total) by mouth 2 (two) times daily.  Dispense: 30 capsule; Refill: 0 -     Cyclobenzaprine HCl; Take 1 tablet (5 mg total) by mouth at bedtime.  Dispense: 30 tablet; Refill: 0  Rx celebrex bid prn pain, recommending to hydrate.  Rx flexeril 5 mg at bedtime. Cautioned daytime use in case of drowsiness. Recommending stretches, topical heat, massage.   Return if symptoms worsen or fail to improve.       Alfredia Ferguson, PA-C  [facility] [phone] (phone) [phone] (fax)  [facility]

## 2023-01-29 ENCOUNTER — Encounter: Payer: Self-pay | Admitting: Physician Assistant

## 2023-02-08 DIAGNOSIS — M533 Sacrococcygeal disorders, not elsewhere classified: Secondary | ICD-10-CM | POA: Diagnosis not present

## 2023-02-08 DIAGNOSIS — M791 Myalgia, unspecified site: Secondary | ICD-10-CM | POA: Diagnosis not present

## 2023-02-09 ENCOUNTER — Other Ambulatory Visit: Payer: Self-pay | Admitting: Medical

## 2023-02-11 ENCOUNTER — Ambulatory Visit (INDEPENDENT_AMBULATORY_CARE_PROVIDER_SITE_OTHER): Payer: Medicare Other | Admitting: Medical

## 2023-02-11 ENCOUNTER — Ambulatory Visit (HOSPITAL_BASED_OUTPATIENT_CLINIC_OR_DEPARTMENT_OTHER)
Admission: RE | Admit: 2023-02-11 | Discharge: 2023-02-11 | Disposition: A | Discharge status: Home / Self Care | Payer: Medicare Other | Source: Ambulatory Visit | Attending: Medical | Admitting: Medical

## 2023-02-11 ENCOUNTER — Encounter: Payer: Self-pay | Admitting: Medical

## 2023-02-11 VITALS — BP 140/70 | HR 78 | Temp 97.9°F | Resp 18 | Ht <= 58 in | Wt 107.0 lb

## 2023-02-11 DIAGNOSIS — M25551 Pain in right hip: Secondary | ICD-10-CM | POA: Insufficient documentation

## 2023-02-11 DIAGNOSIS — I10 Essential (primary) hypertension: Secondary | ICD-10-CM | POA: Diagnosis not present

## 2023-02-11 DIAGNOSIS — E785 Hyperlipidemia, unspecified: Secondary | ICD-10-CM | POA: Diagnosis not present

## 2023-02-11 DIAGNOSIS — M8580 Other specified disorders of bone density and structure, unspecified site: Secondary | ICD-10-CM | POA: Diagnosis not present

## 2023-02-11 LAB — COMPREHENSIVE METABOLIC PANEL
ALT: 20 U/L (ref 0–35)
AST: 23 U/L (ref 0–37)
Albumin: 3.9 g/dL (ref 3.5–5.2)
Alkaline Phosphatase: 100 U/L (ref 39–117)
BUN: 31 mg/dL — ABNORMAL HIGH (ref 6–23)
CO2: 26 meq/L (ref 19–32)
Calcium: 9.2 mg/dL (ref 8.4–10.5)
Chloride: 103 meq/L (ref 96–112)
Creatinine, Ser: 0.93 mg/dL (ref 0.40–1.20)
GFR: 57.42 mL/min — ABNORMAL LOW (ref >60.00)
Glucose, Bld: 72 mg/dL (ref 70–99)
Potassium: 4.4 meq/L (ref 3.5–5.1)
Sodium: 140 meq/L (ref 135–145)
Total Bilirubin: 0.4 mg/dL (ref 0.2–1.2)
Total Protein: 6.6 g/dL (ref 6.0–8.3)

## 2023-02-11 LAB — LIPID PANEL
Cholesterol: 136 mg/dL (ref 0–200)
HDL: 60.6 mg/dL (ref >39.00)
LDL Cholesterol: 51 mg/dL (ref 0–99)
NonHDL: 74.99
Total CHOL/HDL Ratio: 2
Triglycerides: 119 mg/dL (ref 0.0–149.0)
VLDL: 23.8 mg/dL (ref 0.0–40.0)

## 2023-02-11 MED ORDER — METHYLPREDNISOLONE 4 MG PO TABS: ORAL_TABLET | ORAL | 0 refills | Status: DC

## 2023-02-11 NOTE — Patient Instructions (Signed)
 Right Hip Pain Moderate to Severe pain with movement, particularly walking and standing from a seated position. Pain relief from Celebrex . Awaiting insurance approval for dry needling. No recent imaging of the right hip. -Discontinue Celebrex . -Start Medrol  6-day taper. -Order right hip x-ray. -Check in 1 week for update on pain and status of dry needling procedure approval(or other procedure as not clear which procedure will be done?).  Hypertension Blood pressure initially elevated (150/70), but decreased to 140/70 during visit. Currently on Lisinopril  5mg  and Metoprolol  XL 50mg  daily. -Continue current antihypertensive regimen. -Monitor blood pressure, consider increasing Lisinopril  to 10mg  if blood pressure remains high.  Hyperlipidemia Currently on Atorvastatin  20mg  daily. No recent lipid panel. -Continue Atorvastatin  20mg  daily. -Order lipid panel and metabolic panel.   Follow up date to be determined after lab review and your update on pain level as well as potential date of hip procedure pending prior auth

## 2023-02-11 NOTE — Progress Notes (Signed)
 Subjective:    Patient ID: Sandy Summers, female    DOB: Apr 18, 1940, 83 y.o.   MRN: [id number]  HPI Discussed the use of AI scribe software for clinical note transcription with the patient, who gave verbal consent to proceed.  History of Present Illness   Sandy Summers is an 83 year old female who presents with hip pain and difficulty walking.  She has been experiencing significant pain in her right hip for approximately two to three weeks. The pain ranges from five to seven out of ten, particularly when attempting to get out of a chair or walk, and is less severe when sitting or lying down. The pain began after an episode of raking wet leaves, which involved bending and lifting, leading to an inability to move due to pain.  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  She is currently taking Celebrex  100 mg twice daily, which has been helpful for her neck pain but not for her hip pain. Additionally, she takes Flexeril  at bedtime, which she finds effective for sleep. She also takes a blood pressure medication in the morning.  The pain significantly impacts her ability to perform daily activities, such as walking her dogs. No pain while sitting but significant discomfort when attempting to stand or walk. She experiences pain in her back when standing up.         Review of Systems  Constitutional:  Negative for chills, fatigue and fever.  Respiratory:  Negative for chest tightness, shortness of breath and wheezing.   Cardiovascular:  Negative for chest pain and palpitations.  Gastrointestinal:  Negative for abdominal pain and blood in stool.  Musculoskeletal:  Negative for back pain and myalgias.       Rt hip pain.       Objective:   Physical Exam  General Mental Status- Alert. General Appearance- Not in acute distress.    Neck No JVD.  Chest and  Lung Exam Auscultation: Breath Sounds:-CTA  Cardiovascular Auscultation:Rythm- RRR Murmurs & Other Heart Sounds:Auscultation of the heart reveals- No Murmurs.  Abdomen Inspection:-Inspeection Normal. Palpation/Percussion:Note:No mass. Palpation and Percussion of the abdomen reveal- Non Tender, Non Distended + BS, no rebound or guarding.  Neurologic Cranial Nerve exam:- CN III-XII intact(No nystagmus), symmetric smile. Strength:- 5/5 equal and symmetric strength both upper and lower extremities.   Rt hip- on palpation no pain. On abduction and mild rotation no pain. On standing pain worsens.          Assessment & Plan:   Assessment and Plan    Right Hip Pain Moderate to Severe pain with movement, particularly walking and standing from a seated position. Pain relief from Celebrex . Awaiting insurance approval for dry needling. No recent imaging of the right hip. -Discontinue Celebrex . -Start Medrol  6-day taper. -Order right hip x-ray. -Check in 1 week for update on pain and status of dry needling procedure approval(or other procedure as not clear which procedure will be done?).  Hypertension Blood pressure initially elevated (150/70), but decreased to 140/70 during visit. Currently on Lisinopril  5mg  and Metoprolol  XL 50mg  daily. -Continue current antihypertensive regimen. -Monitor blood pressure, consider increasing Lisinopril  to 10mg  if blood pressure remains high.  Hyperlipidemia Currently on Atorvastatin  20mg  daily. No recent lipid panel. -Continue Atorvastatin  20mg  daily. -Order lipid panel and metabolic panel.   Follow up date to be determined after  lab review and your update on pain level as well as potential date of hip procedure pending prior auth

## 2023-02-12 ENCOUNTER — Encounter: Payer: Self-pay | Admitting: Medical

## 2023-02-15 ENCOUNTER — Other Ambulatory Visit: Payer: Self-pay | Admitting: Medical

## 2023-02-28 DIAGNOSIS — M533 Sacrococcygeal disorders, not elsewhere classified: Secondary | ICD-10-CM | POA: Diagnosis not present

## 2023-03-03 ENCOUNTER — Other Ambulatory Visit: Payer: Self-pay | Admitting: Medical

## 2023-03-10 ENCOUNTER — Other Ambulatory Visit: Payer: Self-pay | Admitting: Medical

## 2023-03-15 ENCOUNTER — Emergency Department (HOSPITAL_COMMUNITY)
Admission: EM | Admit: 2023-03-15 | Discharge: 2023-03-15 | Disposition: A | Discharge status: Home / Self Care | Attending: Emergency Medicine | Admitting: Emergency Medicine

## 2023-03-15 ENCOUNTER — Other Ambulatory Visit: Payer: Self-pay

## 2023-03-15 ENCOUNTER — Other Ambulatory Visit: Payer: Self-pay | Admitting: Medical

## 2023-03-15 DIAGNOSIS — G8929 Other chronic pain: Secondary | ICD-10-CM | POA: Insufficient documentation

## 2023-03-15 DIAGNOSIS — M5459 Other low back pain: Secondary | ICD-10-CM | POA: Diagnosis not present

## 2023-03-15 DIAGNOSIS — M545 Low back pain, unspecified: Secondary | ICD-10-CM | POA: Diagnosis not present

## 2023-03-15 DIAGNOSIS — Z7982 Long term (current) use of aspirin: Secondary | ICD-10-CM | POA: Insufficient documentation

## 2023-03-15 LAB — CBC
HCT: 35.3 % — ABNORMAL LOW (ref 36.0–46.0)
Hemoglobin: 10.8 g/dL — ABNORMAL LOW (ref 12.0–15.0)
MCH: 27.1 pg (ref 26.0–34.0)
MCHC: 30.6 g/dL (ref 30.0–36.0)
MCV: 88.5 fL (ref 80.0–100.0)
Platelets: 347 10*3/uL (ref 150–400)
RBC: 3.99 MIL/uL (ref 3.87–5.11)
RDW: 18.4 % — ABNORMAL HIGH (ref 11.5–15.5)
WBC: 12.5 10*3/uL — ABNORMAL HIGH (ref 4.0–10.5)
nRBC: 0 % (ref 0.0–0.2)

## 2023-03-15 LAB — COMPREHENSIVE METABOLIC PANEL
ALT: 16 U/L (ref 0–44)
AST: 18 U/L (ref 15–41)
Albumin: 3.1 g/dL — ABNORMAL LOW (ref 3.5–5.0)
Alkaline Phosphatase: 124 U/L (ref 38–126)
Anion gap: 13 (ref 5–15)
BUN: 15 mg/dL (ref 8–23)
CO2: 23 mmol/L (ref 22–32)
Calcium: 8.8 mg/dL — ABNORMAL LOW (ref 8.9–10.3)
Chloride: 100 mmol/L (ref 98–111)
Creatinine, Ser: 0.78 mg/dL (ref 0.44–1.00)
GFR, Estimated: >60 mL/min (ref >60)
Glucose, Bld: 111 mg/dL — ABNORMAL HIGH (ref 70–99)
Potassium: 3.8 mmol/L (ref 3.5–5.1)
Sodium: 136 mmol/L (ref 135–145)
Total Bilirubin: 1 mg/dL (ref 0.0–1.2)
Total Protein: 7 g/dL (ref 6.5–8.1)

## 2023-03-15 LAB — LIPASE, BLOOD: Lipase: 38 U/L (ref 11–51)

## 2023-03-15 MED ORDER — ACETAMINOPHEN 500 MG PO TABS
1000.0000 mg | ORAL_TABLET | Freq: Once | ORAL | Status: AC
  Administered 2023-03-15: 1000 mg via ORAL
  Filled 2023-03-15: qty 2

## 2023-03-15 MED ORDER — LIDOCAINE 5 % EX PTCH
1.0000 | MEDICATED_PATCH | CUTANEOUS | Status: DC
  Administered 2023-03-15: 1 via TRANSDERMAL
  Filled 2023-03-15: qty 1

## 2023-03-15 MED ORDER — LIDOCAINE 5 % EX PTCH: 1.0000 | MEDICATED_PATCH | CUTANEOUS | 0 refills | Status: AC

## 2023-03-15 NOTE — ED Provider Notes (Signed)
 [place] EMERGENCY DEPARTMENT AT [facility] Provider Note   CSN: [id number] Arrival date & time: 03/15/23  1117     History  Chief Complaint  Patient presents with   Abdominal Pain   Back Pain    Sandy Summers is a 83 y.o. female.  83 year old female with prior medical history as detailed below presents for evaluation.  Patient complains of chronic low back pain.  Patient that her pain was worse this morning.  She denies any specific inciting injury.  She denies fever or chills.  She reports that she uses a cane at baseline.  She is not reporting difficulty ambulating secondary to pain.  She denies any lower extremity weakness.  She denies any change in her urination or bowel movements.  She did not take anything for her pain this morning.  She has used Tylenol, Ultram, other nonnarcotic pain medications in the past.  She reports that "none of these work".  The history is provided by the patient and medical records.       Home Medications Prior to Admission medications   Medication Sig Start Date End Date Taking? Authorizing Provider  aspirin 81 MG chewable tablet Chew 81 mg by mouth daily.      [provider]  atorvastatin (LIPITOR) 20 MG tablet TAKE 1 TABLET BY MOUTH ONCE DAILY . APPOINTMENT REQUIRED FOR FUTURE REFILLS 02/15/23   Saguier, Ramon Dredge, PA-C  celecoxib (CELEBREX) 100 MG capsule Take 1 capsule (100 mg total) by mouth 2 (two) times daily. 01/28/23   Alfredia Ferguson, PA-C  Cholecalciferol 25 MCG (1000 UT) tablet Take 1,000 Units by mouth daily.    [provider]  cyclobenzaprine (FLEXERIL) 5 MG tablet Take 1 tablet (5 mg total) by mouth at bedtime. 01/28/23   Alfredia Ferguson, PA-C  levothyroxine (SYNTHROID) 50 MCG tablet Take 1 tablet by mouth once daily 03/04/23   Saguier, Ramon Dredge, PA-C  lisinopril (ZESTRIL) 5 MG tablet Take 1 tablet by mouth once daily 01/18/23   Saguier, Ramon Dredge, PA-C  methylPREDNISolone (MEDROL) 4 MG tablet Standard 6 day  taper dose 02/11/23   Saguier, Ramon Dredge, PA-C  metoprolol succinate (TOPROL-XL) 50 MG 24 hr tablet TAKE 1 TABLET BY MOUTH AT BEDTIME 03/15/23   Saguier, Ramon Dredge, PA-C  Multiple Vitamins-Minerals (PRESERVISION AREDS 2 PO) Take 1 capsule by mouth daily.    [provider]  senna-docusate (SENOKOT-S) 8.6-50 MG tablet Take 1 tablet by mouth at bedtime as needed for mild constipation. 11/18/20   Jerald Kief, MD  traZODone (DESYREL) 50 MG tablet TAKE 1/2 TO 1 (ONE-HALF TO ONE) TABLET BY MOUTH ONCE DAILY AT BEDTIME 03/11/23   Saguier, Ramon Dredge, PA-C      Allergies    Alendronate, Amoxicillin-pot clavulanate, Denosumab, Gabapentin, and Hydrocodone-acetaminophen    Review of Systems   Review of Systems  All other systems reviewed and are negative.   Physical Exam Updated Vital Signs BP (!) 160/94 (BP Location: Right Arm)   Pulse (!) 109   Temp 98.3 F (36.8 C) (Oral)   Resp 18   Ht 4\' 10"  (1.473 m)   Wt 49 kg   SpO2 94%   BMI 22.57 kg/m  Physical Exam Vitals and nursing note reviewed.  Constitutional:      General: She is not in acute distress.    Appearance: Normal appearance. She is well-developed.  HENT:     Head: Normocephalic and atraumatic.  Eyes:     Conjunctiva/sclera: Conjunctivae normal.     Pupils: Pupils  are equal, round, and reactive to light.  Cardiovascular:     Rate and Rhythm: Normal rate and regular rhythm.     Heart sounds: Normal heart sounds.  Pulmonary:     Effort: Pulmonary effort is normal. No respiratory distress.     Breath sounds: Normal breath sounds.  Abdominal:     General: There is no distension.     Palpations: Abdomen is soft.     Tenderness: There is no abdominal tenderness.  Musculoskeletal:        General: No deformity. Normal range of motion.     Cervical back: Normal range of motion and neck supple.  Skin:    General: Skin is warm and dry.  Neurological:     General: No focal deficit present.     Mental Status: She is alert and  oriented to person, place, and time.     Comments: Localizes pain to the diffuse low back.  No appreciable midline tenderness.  5 out of 5 strength to both lower extremities.       ED Results / Procedures / Treatments   Labs (all labs ordered are listed, but only abnormal results are displayed) Labs Reviewed  COMPREHENSIVE METABOLIC PANEL - Abnormal; Notable for the following components:      Result Value   Glucose, Bld 111 (*)    Calcium 8.8 (*)    Albumin 3.1 (*)    All other components within normal limits  CBC - Abnormal; Notable for the following components:   WBC 12.5 (*)    Hemoglobin 10.8 (*)    HCT 35.3 (*)    RDW 18.4 (*)    All other components within normal limits  LIPASE, BLOOD  URINALYSIS, ROUTINE W REFLEX MICROSCOPIC    EKG None  Radiology No results found.  Procedures Procedures    Medications Ordered in ED Medications  lidocaine (LIDODERM) 5 % 1 patch (1 patch Transdermal Patch Applied 03/15/23 1354)  acetaminophen (TYLENOL) tablet 1,000 mg (1,000 mg Oral Given 03/15/23 1352)    ED Course/ Medical Decision Making/ A&P                                 Medical Decision Making Amount and/or Complexity of Data Reviewed Labs: ordered.  Risk OTC drugs. Prescription drug management.    Medical Screen Complete  This patient presented to the ED with complaint of back pain.  This complaint involves an extensive number of treatment options. The initial differential diagnosis includes, but is not limited to, chronic back pain, metabolic abnormality, etc.  This presentation is: Acute, Chronic, Self-Limited, Previously Undiagnosed, Uncertain Prognosis, Complicated, Systemic Symptoms, and Threat to Life/Bodily Function  With long that history of chronic back pain.  Patient reports poor control of her pain with Ultram.  Patient is requesting trial of other medication.  With administration of Lidoderm patch and Tylenol, patient feels much improved.  She  now desires discharge home.  She is able to ambulate at baseline.  She and her husband understand need for close outpatient follow-up.  Strict return precautions given and understood.  Screening labs obtained are without significant abnormality  Co morbidities that complicated the patient's evaluation  See HPI   Additional history obtained: External records from outside sources obtained and reviewed including prior ED visits and prior Inpatient records.    Lab Tests:  I ordered and personally interpreted labs.  The pertinent results include: CBC, CMP,  lipase     Problem List / ED Course:  Chronic back pain   Reevaluation:  After the interventions noted above, I reevaluated the patient and found that they have: improved Disposition:  After consideration of the diagnostic results and the patients response to treatment, I feel that the patent would benefit from close outpatient follow-up..          Final Clinical Impression(s) / ED Diagnoses Final diagnoses:  Chronic low back pain without sciatica, unspecified back pain laterality    Rx / DC Orders ED Discharge Orders          Ordered    lidocaine (LIDODERM) 5 %  Every 24 hours        03/15/23 1515              Wynetta Fines, MD 03/15/23 1521

## 2023-03-15 NOTE — ED Triage Notes (Signed)
 Pt arrives with c/o abdominal pain that radiates to her back, states this has been going on for weeks.

## 2023-03-15 NOTE — Discharge Instructions (Signed)
 Return for any problem.   Use Lidoderm patches as prescribed for pain.  You may also take Tylenol with the Lidoderm patch for pain.

## 2023-03-18 DIAGNOSIS — M545 Low back pain, unspecified: Secondary | ICD-10-CM | POA: Diagnosis not present

## 2023-03-19 ENCOUNTER — Other Ambulatory Visit (HOSPITAL_BASED_OUTPATIENT_CLINIC_OR_DEPARTMENT_OTHER): Payer: Self-pay | Admitting: Physical Medicine and Rehabilitation

## 2023-03-19 DIAGNOSIS — M545 Low back pain, unspecified: Secondary | ICD-10-CM

## 2023-03-19 DIAGNOSIS — M7918 Myalgia, other site: Secondary | ICD-10-CM

## 2023-03-20 ENCOUNTER — Other Ambulatory Visit: Payer: Self-pay | Admitting: Medical

## 2023-03-22 ENCOUNTER — Ambulatory Visit (HOSPITAL_BASED_OUTPATIENT_CLINIC_OR_DEPARTMENT_OTHER)
Admission: RE | Admit: 2023-03-22 | Discharge: 2023-03-22 | Disposition: A | Discharge status: Home / Self Care | Source: Ambulatory Visit | Attending: Physical Medicine and Rehabilitation | Admitting: Physical Medicine and Rehabilitation

## 2023-03-22 DIAGNOSIS — M8448XA Pathological fracture, other site, initial encounter for fracture: Secondary | ICD-10-CM | POA: Diagnosis not present

## 2023-03-22 DIAGNOSIS — I700 Atherosclerosis of aorta: Secondary | ICD-10-CM | POA: Diagnosis not present

## 2023-03-22 DIAGNOSIS — R262 Difficulty in walking, not elsewhere classified: Secondary | ICD-10-CM | POA: Diagnosis not present

## 2023-03-22 DIAGNOSIS — M7918 Myalgia, other site: Secondary | ICD-10-CM | POA: Diagnosis not present

## 2023-03-22 DIAGNOSIS — S3210XA Unspecified fracture of sacrum, initial encounter for closed fracture: Secondary | ICD-10-CM | POA: Diagnosis not present

## 2023-03-22 DIAGNOSIS — K5790 Diverticulosis of intestine, part unspecified, without perforation or abscess without bleeding: Secondary | ICD-10-CM | POA: Diagnosis not present

## 2023-03-22 DIAGNOSIS — M545 Low back pain, unspecified: Secondary | ICD-10-CM | POA: Diagnosis not present

## 2023-03-22 DIAGNOSIS — R109 Unspecified abdominal pain: Secondary | ICD-10-CM | POA: Diagnosis not present

## 2023-03-22 DIAGNOSIS — K5730 Diverticulosis of large intestine without perforation or abscess without bleeding: Secondary | ICD-10-CM | POA: Diagnosis not present

## 2023-03-25 ENCOUNTER — Encounter: Payer: Self-pay | Admitting: Medical

## 2023-03-25 ENCOUNTER — Ambulatory Visit (INDEPENDENT_AMBULATORY_CARE_PROVIDER_SITE_OTHER): Admitting: Medical

## 2023-03-25 VITALS — HR 92 | Temp 98.3°F | Resp 18 | Ht <= 58 in | Wt 110.0 lb

## 2023-03-25 DIAGNOSIS — M5440 Lumbago with sciatica, unspecified side: Secondary | ICD-10-CM

## 2023-03-25 DIAGNOSIS — D649 Anemia, unspecified: Secondary | ICD-10-CM

## 2023-03-25 DIAGNOSIS — M25551 Pain in right hip: Secondary | ICD-10-CM

## 2023-03-25 DIAGNOSIS — D72829 Elevated white blood cell count, unspecified: Secondary | ICD-10-CM

## 2023-03-25 DIAGNOSIS — R944 Abnormal results of kidney function studies: Secondary | ICD-10-CM | POA: Diagnosis not present

## 2023-03-25 DIAGNOSIS — G8929 Other chronic pain: Secondary | ICD-10-CM

## 2023-03-25 LAB — CBC WITH DIFFERENTIAL/PLATELET
Basophils Absolute: 0 10*3/uL (ref 0.0–0.1)
Basophils Relative: 0.5 % (ref 0.0–3.0)
Eosinophils Absolute: 0.1 10*3/uL (ref 0.0–0.7)
Eosinophils Relative: 1.3 % (ref 0.0–5.0)
HCT: 34.2 % — ABNORMAL LOW (ref 36.0–46.0)
Hemoglobin: 11 g/dL — ABNORMAL LOW (ref 12.0–15.0)
Lymphocytes Relative: 12.1 % (ref 12.0–46.0)
Lymphs Abs: 0.9 10*3/uL (ref 0.7–4.0)
MCHC: 32.1 g/dL (ref 30.0–36.0)
MCV: 85.4 fl (ref 78.0–100.0)
Monocytes Absolute: 0.8 10*3/uL (ref 0.1–1.0)
Monocytes Relative: 9.9 % (ref 3.0–12.0)
Neutro Abs: 5.8 10*3/uL (ref 1.4–7.7)
Neutrophils Relative %: 76.2 % (ref 43.0–77.0)
Platelets: 540 10*3/uL — ABNORMAL HIGH (ref 150.0–400.0)
RBC: 4.01 Mil/uL (ref 3.87–5.11)
RDW: 19 % — ABNORMAL HIGH (ref 11.5–15.5)
WBC: 7.6 10*3/uL (ref 4.0–10.5)

## 2023-03-25 LAB — COMPREHENSIVE METABOLIC PANEL
ALT: 13 U/L (ref 0–35)
AST: 16 U/L (ref 0–37)
Albumin: 3.9 g/dL (ref 3.5–5.2)
Alkaline Phosphatase: 125 U/L — ABNORMAL HIGH (ref 39–117)
BUN: 15 mg/dL (ref 6–23)
CO2: 26 meq/L (ref 19–32)
Calcium: 9.1 mg/dL (ref 8.4–10.5)
Chloride: 100 meq/L (ref 96–112)
Creatinine, Ser: 0.77 mg/dL (ref 0.40–1.20)
GFR: 71.96 mL/min (ref >60.00)
Glucose, Bld: 78 mg/dL (ref 70–99)
Potassium: 4.3 meq/L (ref 3.5–5.1)
Sodium: 139 meq/L (ref 135–145)
Total Bilirubin: 0.4 mg/dL (ref 0.2–1.2)
Total Protein: 6.8 g/dL (ref 6.0–8.3)

## 2023-03-25 MED ORDER — ACETAMINOPHEN-CODEINE 300-30 MG PO TABS: 1.0000 | ORAL_TABLET | Freq: Three times a day (TID) | ORAL | 0 refills | Status: AC | PRN

## 2023-03-25 NOTE — Patient Instructions (Signed)
 Chronic Hip and Low Back Pain Chronic, severe pain in hips and low back, radiating to buttocks. Previous treatments ineffective. Tramadol caused cognitive side effects. Pain affects mobility and daily activities. Differential includes arthritis and nerve involvement from lumbar spine degenerative changes and foraminal narrowing. Awaiting CT pelvis results. Considering codeine due to side effects with tramadol and hydrocodone, with informed consent.(benefits vs risk discussed). - Await CT pelvis results to determine next steps. - Consider referral to neurosurgeon or back specialist depending on CT results. - Prescribe Tylenol with codeine (300 mg Tylenol, 30 mg codeine) with caution. Limit to 10 tablets initially. - Instruct her to use a walker and have assistance when taking the first dose of Tylenol with codeine. Husband with pt today. and will watch. - Monitor for allergic reactions or side effects from Tylenol with codeine. - Follow up with orthopedist for potential expedited referral to a back specialist or neurosurgeon.  Decreased Kidney Function Decreased kidney function with slightly elevated BUN and decreased GFR, possibly due to mild dehydration. Function is average or slightly better than average for age. Avoid daily anti-inflammatory medications. - Repeat metabolic panel to assess kidney function.  Elevated White Blood Cell Count Mildly elevated WBC count (12.5), possibly due to stress or recent steroid use. No signs of infection. - Repeat CBC to monitor WBC trend.  General Health Maintenance On atorvastatin 20 mg daily for lipid management. Lipid panel results are good.

## 2023-03-25 NOTE — Progress Notes (Signed)
 Subjective:    Patient ID: Sandy Summers, female    DOB: 03-Jun-1940, 83 y.o.   MRN: [id number]  HPI Sandy Summers is an 83 year old female who presents with worsening bilateral hip pain and medication side effects. She is accompanied by her husband.  She was referred by Dr. Regino Schultze for evaluation of her chronic hip pain.  She experiences severe bilateral hip pain that affects her ability to walk and perform daily activities. The pain initially started in the right hip and has now spread to both hips and the buttocks. Previous treatments, including steroid injections and various pain medications, have not provided relief.  She has experienced significant side effects from tramadol, including confusion and mental distress. Celebrex and Lidoderm patches were ineffective. She has a history of adverse reactions to gabapentin and hydrocodone. Currently, she is taking atorvastatin 20 mg daily.  She has a history of chronic low back pain. An MRI of her back in November showed degenerative changes at L5-S1 and narrowing at L3-L4 where the nerve exits the spine. A CT scan of the pelvis was performed on March 22, 2023, with results pending.  Her recent laboratory workup in February showed a low BUN and decreased GFR, suggesting stable decreased kidney function for her age. She was possibly mildly dehydrated at that time. A CBC from March 15, 2023, showed a slightly elevated WBC count, which may be attributed to stress or steroid use. No obvous infection signs/symptoms.  No pain radiating to her foot, and the pain is localized to her hips and buttocks. No history of ulcers in her stomach.       Review of Systems  Constitutional:  Negative for chills, fatigue and fever.  HENT:  Negative for congestion and ear pain.   Respiratory:  Negative for cough, chest tightness, shortness of breath and wheezing.   Cardiovascular:  Negative for chest pain and palpitations.  Gastrointestinal:  Negative for  abdominal pain.  Genitourinary:  Negative for dysuria and frequency.  Musculoskeletal:        Hip pain.  Skin:  Negative for rash.  Neurological:  Negative for dizziness, seizures, syncope and light-headedness.  Hematological:  Negative for adenopathy. Does not bruise/bleed easily.  Psychiatric/Behavioral:  Negative for behavioral problems and confusion.     Past Medical History:  Diagnosis Date   Hyperlipidemia    Hypertension    Thyroid disease      Social History   Socioeconomic History   Marital status: Married    Spouse name: Not on file   Number of children: Not on file   Years of education: Not on file   Highest education level: Not on file  Occupational History   Not on file  Tobacco Use   Smoking status: Former    Current packs/day: 0.00    Types: Cigarettes    Quit date: 10/06/1995    Years since quitting: 27.4   Smokeless tobacco: Never  Substance and Sexual Activity   Alcohol use: No   Drug use: No   Sexual activity: Not Currently  Other Topics Concern   Not on file  Social History Narrative   Not on file   Social Drivers of Health   Financial Resource Strain: Low Risk  (04/20/2021)   Overall Financial Resource Strain (CARDIA)    Difficulty of Paying Living Expenses: Not hard at all  Food Insecurity: No Food Insecurity (04/30/2022)   Hunger Vital Sign    Worried About Running Out of Food in  the Last Year: Never true    Ran Out of Food in the Last Year: Never true  Transportation Needs: No Transportation Needs (04/30/2022)   PRAPARE - Administrator, Civil Service (Medical): No    Lack of Transportation (Non-Medical): No  Physical Activity: Sufficiently Active (04/20/2021)   Exercise Vital Sign    Days of Exercise per Week: 7 days    Minutes of Exercise per Session: 30 min  Stress: No Stress Concern Present (04/20/2021)   [employer] of Occupational Health - Occupational Stress Questionnaire    Feeling of Stress : Not at all  Social  Connections: Moderately Integrated (04/20/2021)   Social Connection and Isolation Panel [NHANES]    Frequency of Communication with Friends and Family: More than three times a week    Frequency of Social Gatherings with Friends and Family: More than three times a week    Attends Religious Services: Never    Database administrator or Organizations: Yes    Attends Engineer, structural: More than 4 times per year    Marital Status: Married  Catering manager Violence: Not At Risk (04/30/2022)   Humiliation, Afraid, Rape, and Kick questionnaire    Fear of Current or Ex-Partner: No    Emotionally Abused: No    Physically Abused: No    Sexually Abused: No    Past Surgical History:  Procedure Laterality Date   CORONARY ANGIOPLASTY WITH STENT PLACEMENT  in 2002   HIP PINNING,CANNULATED Left 11/16/2020   Procedure: CANNULATED HIP PINNING;  Surgeon: Ernestina Columbia, MD;  Location: [facility];  Service: Orthopedics;  Laterality: Left;  synthes, cannulated screws, hanna table, large c-arm,    can go as early as 2 p.m.    No family history on file.  Allergies  Allergen Reactions   Alendronate Nausea Only   Amoxicillin-Pot Clavulanate Diarrhea   Denosumab Other (See Comments)    I just didn't like it    Gabapentin Other (See Comments)    Ineffective for sleep "made me crazy"   Hydrocodone-Acetaminophen Other (See Comments)   Tramadol Other (See Comments)    Delirium     Current Outpatient Medications on File Prior to Visit  Medication Sig Dispense Refill   aspirin 81 MG chewable tablet Chew 81 mg by mouth daily.       atorvastatin (LIPITOR) 20 MG tablet TAKE 1 TABLET BY MOUTH ONCE DAILY . APPOINTMENT REQUIRED FOR FUTURE REFILLS 30 tablet 0   celecoxib (CELEBREX) 100 MG capsule Take 1 capsule (100 mg total) by mouth 2 (two) times daily. 30 capsule 0   Cholecalciferol 25 MCG (1000 UT) tablet Take 1,000 Units by mouth daily.     cyclobenzaprine (FLEXERIL) 5 MG tablet Take 1 tablet (5 mg  total) by mouth at bedtime. 30 tablet 0   levothyroxine (SYNTHROID) 50 MCG tablet Take 1 tablet by mouth once daily 90 tablet 0   lidocaine (LIDODERM) 5 % Place 1 patch onto the skin daily. Remove & Discard patch within 12 hours or as directed by MD 30 patch 0   lisinopril (ZESTRIL) 5 MG tablet Take 1 tablet by mouth once daily 90 tablet 0   methylPREDNISolone (MEDROL) 4 MG tablet Standard 6 day taper dose 21 tablet 0   metoprolol succinate (TOPROL-XL) 50 MG 24 hr tablet TAKE 1 TABLET BY MOUTH AT BEDTIME 90 tablet 0   Multiple Vitamins-Minerals (PRESERVISION AREDS 2 PO) Take 1 capsule by mouth daily.     senna-docusate (SENOKOT-S)  8.6-50 MG tablet Take 1 tablet by mouth at bedtime as needed for mild constipation. 30 tablet 0   traZODone (DESYREL) 50 MG tablet TAKE 1/2 TO 1 (ONE-HALF TO ONE) TABLET BY MOUTH ONCE DAILY AT BEDTIME 30 tablet 0   No current facility-administered medications on file prior to visit.    Pulse 92   Temp 98.3 F (36.8 C)   Resp 18   Ht 4\' 10"  (1.473 m)   Wt 110 lb (49.9 kg)   SpO2 96%   BMI 22.99 kg/m        Objective:   Physical Exam  General Mental Status- Alert. General Appearance- Not in acute distress.   Skin General: Color- Normal Color. Moisture- Normal Moisture.  Chest and Lung Exam Auscultation: Breath Sounds:-CTA  Cardiovascular Auscultation:Rythm- RRR Murmurs & Other Heart Sounds:Auscultation of the heart reveals- No Murmurs.  Abdomen Inspection:-Inspeection Normal. Palpation/Percussion:Note:No mass. Palpation and Percussion of the abdomen reveal- Non Tender, Non Distended + BS, no rebound or guarding.   Neurologic Cranial Nerve exam:- CN III-XII intact(No nystagmus), symmetric smile. Strength:- 5/5 equal and symmetric strength both upper and lower extremities.   Hip- pain on rom. Worse pain on left side today. Rt side was worse on last visit. Pain on ambulation.       Assessment & Plan:   Assessment and Plan    Chronic  Hip and Low Back Pain Chronic, severe pain in hips and low back, radiating to buttocks. Previous treatments ineffective. Tramadol caused cognitive side effects. Pain affects mobility and daily activities. Differential includes arthritis and nerve involvement from lumbar spine degenerative changes and foraminal narrowing. Awaiting CT pelvis results. Considering codeine due to side effects with tramadol and hydrocodone, with informed consent.(benefits vs risk discussed). - Await CT pelvis results to determine next steps. - Consider referral to neurosurgeon or back specialist depending on CT results. - Prescribe Tylenol with codeine (300 mg Tylenol, 30 mg codeine) with caution. Limit to 10 tablets initially. - Instruct her to use a walker and have assistance when taking the first dose of Tylenol with codeine. Husband with pt today. and will watch. - Monitor for allergic reactions or side effects from Tylenol with codeine. - Follow up with orthopedist for potential expedited referral to a back specialist or neurosurgeon.  Decreased Kidney Function Decreased kidney function with slightly elevated BUN and decreased GFR, possibly due to mild dehydration. Function is average or slightly better than average for age. Avoid daily anti-inflammatory medications. - Repeat metabolic panel to assess kidney function.  Elevated White Blood Cell Count Mildly elevated WBC count (12.5), possibly due to stress or recent steroid use. No signs of infection. - Repeat CBC to monitor WBC trend.  General Health Maintenance On atorvastatin 20 mg daily for lipid management. Lipid panel results are good.        Esperanza Richters, PA-C   Time spent with patient today was  42 minutes which consisted of chart review, discussing diagnosis, work up treatment and documentation.

## 2023-03-26 LAB — IRON,TIBC AND FERRITIN PANEL
%SAT: 8 % — ABNORMAL LOW (ref 16–45)
Ferritin: 38 ng/mL (ref 16–288)
Iron: 27 ug/dL — ABNORMAL LOW (ref 45–160)
TIBC: 319 ug/dL (ref 250–450)

## 2023-03-28 ENCOUNTER — Ambulatory Visit: Admitting: Medical

## 2023-03-28 ENCOUNTER — Other Ambulatory Visit: Payer: Self-pay | Admitting: Physical Medicine and Rehabilitation

## 2023-03-28 ENCOUNTER — Telehealth (HOSPITAL_BASED_OUTPATIENT_CLINIC_OR_DEPARTMENT_OTHER): Payer: Self-pay

## 2023-03-28 VITALS — BP 110/60 | HR 86 | Resp 18 | Ht <= 58 in | Wt 110.0 lb

## 2023-03-28 DIAGNOSIS — S32040A Wedge compression fracture of fourth lumbar vertebra, initial encounter for closed fracture: Secondary | ICD-10-CM | POA: Diagnosis not present

## 2023-03-28 DIAGNOSIS — G8929 Other chronic pain: Secondary | ICD-10-CM

## 2023-03-28 DIAGNOSIS — D649 Anemia, unspecified: Secondary | ICD-10-CM | POA: Diagnosis not present

## 2023-03-28 DIAGNOSIS — M818 Other osteoporosis without current pathological fracture: Secondary | ICD-10-CM | POA: Diagnosis not present

## 2023-03-28 DIAGNOSIS — M5440 Lumbago with sciatica, unspecified side: Secondary | ICD-10-CM

## 2023-03-28 DIAGNOSIS — M25551 Pain in right hip: Secondary | ICD-10-CM

## 2023-03-28 DIAGNOSIS — M533 Sacrococcygeal disorders, not elsewhere classified: Secondary | ICD-10-CM

## 2023-03-28 MED ORDER — ACETAMINOPHEN ER 650 MG PO TBCR: EXTENDED_RELEASE_TABLET | ORAL | 1 refills | Status: AC

## 2023-03-28 MED ORDER — IRON (FERROUS SULFATE) 325 (65 FE) MG PO TABS: 325.0000 mg | ORAL_TABLET | Freq: Every day | ORAL | 0 refills | Status: DC

## 2023-03-28 NOTE — Addendum Note (Signed)
 Addended by: Kathi Simpers on: 03/28/2023 08:56 AM   Modules accepted: Orders

## 2023-03-28 NOTE — Progress Notes (Signed)
 Subjective:    Patient ID: RONNICA DREESE, female    DOB: [birth year], 83 y.o.   MRN: [id number]  HPI Pt I n for follow up on last visit.   Below in  last A/P    "Chronic Hip and Low Back Pain Chronic, severe pain in hips and low back, radiating to buttocks. Previous treatments ineffective. Tramadol caused cognitive side effects. Pain affects mobility and daily activities. Differential includes arthritis and nerve involvement from lumbar spine degenerative changes and foraminal narrowing. Awaiting CT pelvis results. Considering codeine due to side effects with tramadol and hydrocodone, with informed consent.(benefits vs risk discussed). - Await CT pelvis results to determine next steps. - Consider referral to neurosurgeon or back specialist depending on CT results. - Prescribe Tylenol with codeine (300 mg Tylenol, 30 mg codeine) with caution. Limit to 10 tablets initially. - Instruct her to use a walker and have assistance when taking the first dose of Tylenol with codeine. Husband with pt today. and will watch. - Monitor for allergic reactions or side effects from Tylenol with codeine. - Follow up with orthopedist for potential expedited referral to a back specialist or neurosurgeon.   Decreased Kidney Function Decreased kidney function with slightly elevated BUN and decreased GFR, possibly due to mild dehydration. Function is average or slightly better than average for age. Avoid daily anti-inflammatory medications. - Repeat metabolic panel to assess kidney function.   Elevated White Blood Cell Count Mildly elevated WBC count (12.5), possibly due to stress or recent steroid use. No signs of infection. - Repeat CBC to monitor WBC trend.   General Health Maintenance On atorvastatin 20 mg daily for lipid management. Lipid panel results are good."    Discussed the use of AI scribe software for clinical note transcription with the patient, who gave verbal consent to  proceed.  History of Present Illness   MELVIN MARMO is an 83 year old female with chronic hip and low back pain who presents for follow-up on pain management and recent imaging results. She is accompanied by her caregiver, who provides additional information during the visit.  She has ongoing chronic hip and low back pain. Previously, she experienced cognitive side effects from tramadol, describing it as making her feel 'loopy.' She has since switched to Tylenol with codeine, which she finds more effective for pain relief, particularly at night, although it still causes some mild cognitive effects. She takes this medication at night to aid with sleep and prevent waking due to pain when changing positions in bed.  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 Her pain is severe when not sitting, but she has been walking better recently. She has not been contacted by the pain specialist who ordered the CT pelvis, and she is unsure about the next steps regarding her back issues.  She has a history of osteoporosis and was previously on bisphosphonate therapy, receiving injections every six months for a period in her late 35. She discontinued the treatment due to scheduling issues and discomfort from the injections. A DEXA scan in 2023 indicated her current bone density status.  Recent lab work showed stable kidney function, with a GFR of 71, and a slightly elevated alkaline phosphatase level. She has stable anemia but low iron levels. She is not currently taking any iron supplements and denies any black or bloody stools.       Review of Systems  Constitutional:  Negative for chills, fatigue  and fever.  HENT:  Negative for dental problem, ear discharge and facial swelling.   Respiratory:  Negative for cough, chest tightness and wheezing.   Cardiovascular:  Negative for chest pain and palpitations.  Gastrointestinal:   Negative for abdominal pain, constipation and rectal pain.  Genitourinary:  Negative for dysuria and flank pain.  Musculoskeletal:  Positive for back pain. Negative for joint swelling and neck stiffness.  Skin:  Negative for rash.  Neurological:  Negative for dizziness, seizures, syncope, weakness and headaches.  Hematological:  Negative for adenopathy.  Psychiatric/Behavioral:  Negative for behavioral problems. The patient is not hyperactive.     Past Medical History:  Diagnosis Date   Hyperlipidemia    Hypertension    Thyroid disease      Social History   Socioeconomic History   Marital status: Married    Spouse name: Not on file   Number of children: Not on file   Years of education: Not on file   Highest education level: Not on file  Occupational History   Not on file  Tobacco Use   Smoking status: Former    Current packs/day: 0.00    Types: Cigarettes    Quit date: 10/06/1995    Years since quitting: 27.4   Smokeless tobacco: Never  Substance and Sexual Activity   Alcohol use: No   Drug use: No   Sexual activity: Not Currently  Other Topics Concern   Not on file  Social History Narrative   Not on file   Social Drivers of Health   Financial Resource Strain: Low Risk  (04/20/2021)   Overall Financial Resource Strain (CARDIA)    Difficulty of Paying Living Expenses: Not hard at all  Food Insecurity: No Food Insecurity (04/30/2022)   Hunger Vital Sign    Worried About Running Out of Food in the Last Year: Never true    Ran Out of Food in the Last Year: Never true  Transportation Needs: No Transportation Needs (04/30/2022)   PRAPARE - Administrator, Civil Service (Medical): No    Lack of Transportation (Non-Medical): No  Physical Activity: Sufficiently Active (04/20/2021)   Exercise Vital Sign    Days of Exercise per Week: 7 days    Minutes of Exercise per Session: 30 min  Stress: No Stress Concern Present (04/20/2021)   [employer] of  Occupational Health - Occupational Stress Questionnaire    Feeling of Stress : Not at all  Social Connections: Moderately Integrated (04/20/2021)   Social Connection and Isolation Panel [NHANES]    Frequency of Communication with Friends and Family: More than three times a week    Frequency of Social Gatherings with Friends and Family: More than three times a week    Attends Religious Services: Never    Database administrator or Organizations: Yes    Attends Engineer, structural: More than 4 times per year    Marital Status: Married  Catering manager Violence: Not At Risk (04/30/2022)   Humiliation, Afraid, Rape, and Kick questionnaire    Fear of Current or Ex-Partner: No    Emotionally Abused: No    Physically Abused: No    Sexually Abused: No    Past Surgical History:  Procedure Laterality Date   CORONARY ANGIOPLASTY WITH STENT PLACEMENT  in 2002   HIP PINNING,CANNULATED Left 11/16/2020   Procedure: CANNULATED HIP PINNING;  Surgeon: Ernestina Columbia, MD;  Location: [facility];  Service: Orthopedics;  Laterality: Left;  synthes, cannulated screws, hanna  table, large c-arm,    can go as early as 2 p.m.    No family history on file.  Allergies  Allergen Reactions   Alendronate Nausea Only   Amoxicillin-Pot Clavulanate Diarrhea   Denosumab Other (See Comments)    I just didn't like it    Gabapentin Other (See Comments)    Ineffective for sleep "made me crazy"   Hydrocodone-Acetaminophen Other (See Comments)   Tramadol Other (See Comments)    Delirium     Current Outpatient Medications on File Prior to Visit  Medication Sig Dispense Refill   acetaminophen-codeine (TYLENOL #3) 300-30 MG tablet Take 1 tablet by mouth every 8 (eight) hours as needed for moderate pain (pain score 4-6). 10 tablet 0   aspirin 81 MG chewable tablet Chew 81 mg by mouth daily.       atorvastatin (LIPITOR) 20 MG tablet TAKE 1 TABLET BY MOUTH ONCE DAILY . APPOINTMENT REQUIRED FOR FUTURE REFILLS 30  tablet 0   celecoxib (CELEBREX) 100 MG capsule Take 1 capsule (100 mg total) by mouth 2 (two) times daily. 30 capsule 0   Cholecalciferol 25 MCG (1000 UT) tablet Take 1,000 Units by mouth daily.     cyclobenzaprine (FLEXERIL) 5 MG tablet Take 1 tablet (5 mg total) by mouth at bedtime. 30 tablet 0   levothyroxine (SYNTHROID) 50 MCG tablet Take 1 tablet by mouth once daily 90 tablet 0   lidocaine (LIDODERM) 5 % Place 1 patch onto the skin daily. Remove & Discard patch within 12 hours or as directed by MD 30 patch 0   lisinopril (ZESTRIL) 5 MG tablet Take 1 tablet by mouth once daily 90 tablet 0   methylPREDNISolone (MEDROL) 4 MG tablet Standard 6 day taper dose 21 tablet 0   metoprolol succinate (TOPROL-XL) 50 MG 24 hr tablet TAKE 1 TABLET BY MOUTH AT BEDTIME 90 tablet 0   Multiple Vitamins-Minerals (PRESERVISION AREDS 2 PO) Take 1 capsule by mouth daily.     senna-docusate (SENOKOT-S) 8.6-50 MG tablet Take 1 tablet by mouth at bedtime as needed for mild constipation. 30 tablet 0   traZODone (DESYREL) 50 MG tablet TAKE 1/2 TO 1 (ONE-HALF TO ONE) TABLET BY MOUTH ONCE DAILY AT BEDTIME 30 tablet 0   No current facility-administered medications on file prior to visit.    BP 110/60   Pulse 86   Resp 18   Ht 4\' 10"  (1.473 m)   Wt 110 lb (49.9 kg)   SpO2 98%   BMI 22.99 kg/m        Objective:   General Mental Status- Alert. General Appearance- Not in acute distress.    Skin General: Color- Normal Color. Moisture- Normal Moisture.   Chest and Lung Exam Auscultation: Breath Sounds:-CTA   Cardiovascular Auscultation:Rythm- RRR Murmurs & Other Heart Sounds:Auscultation of the heart reveals- No Murmurs.   Abdomen Inspection:-Inspeection Normal. Palpation/Percussion:Note:No mass. Palpation and Percussion of the abdomen reveal- Non Tender, Non Distended + BS, no rebound or guarding.     Neurologic Cranial Nerve exam:- CN III-XII intact(No nystagmus), symmetric smile. Strength:- 5/5  equal and symmetric strength both upper and lower extremities.    Hip- pain on rom. Worse pain on left side today. Rt side was worse on last visit. Pain on ambulation.     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 Tylenol with codeine effective for pain management with  fewer cognitive effects than tramadol. - Continue Tylenol 650 mg during the day, one tablet in the morning and one in the afternoon. - Use Tylenol with codeine at night. - Avoid taking Tylenol 650 mg and Tylenol with codeine together. - Update by Tuesday regarding contact from the pain specialist. - Consider referral to a neurosurgeon if no plan is in place by the pain specialist.  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  Kyphoplasty as a potential treatment. - Await contact from the pain specialist regarding further management. - Consider referral to a neurosurgeon for potential kyphoplasty if no plan is in place by the pain specialist.  Osteoporosis Osteoporosis with previous bisphosphonate therapy discontinued. DEXA scan in 2023 discussed for reassessment. - Order DEXA scan to assess current bone density. - Consider starting a tablet medication for bone density after DEXA scan results.  Anemia Anemia with stable levels but low iron. Investigate potential causes of low iron, including gastrointestinal bleeding. - Order stool test to check for blood. - Prescribe over-the-counter iron tablet, one tablet daily for one month. - Follow up in one month to recheck iron level and CBC anemia level.   Follow up one month to receheck anemia level and iron leve. Update me on pain management plan/MD who order ct pelvis   .        Esperanza Richters, PA-C

## 2023-03-28 NOTE — Patient Instructions (Signed)
 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 Tylenol with codeine effective for pain management with fewer cognitive effects than tramadol. - Continue Tylenol 650 mg during the day, one tablet in the morning and one in the afternoon. - Use Tylenol with codeine at night. - Avoid taking Tylenol 650 mg and Tylenol with codeine together. - Update by Tuesday regarding contact from the pain specialist. - Consider referral to a neurosurgeon if no plan is in place by the pain specialist.  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  Kyphoplasty as a potential treatment. - Await contact from the pain specialist regarding further management. - Consider referral to a neurosurgeon for potential kyphoplasty if no plan is in place by the pain specialist.  Osteoporosis Osteoporosis with previous bisphosphonate therapy discontinued. DEXA scan in 2023 discussed for reassessment. - Order DEXA scan to assess current bone density. - Consider starting a tablet medication for bone density after DEXA scan results.  Anemia Anemia with stable levels but low iron. Investigate potential causes of low iron, including gastrointestinal bleeding. - Order stool test to check for blood. - Prescribe over-the-counter iron tablet, one tablet daily for one month. - Follow up in one month to recheck iron level and CBC anemia level.   Follow up one month to receheck anemia level and iron leve. Update me on pain management plan/MD who order ct pelvis   .

## 2023-04-01 ENCOUNTER — Ambulatory Visit
Admission: RE | Admit: 2023-04-01 | Discharge: 2023-04-01 | Disposition: A | Discharge status: Home / Self Care | Source: Ambulatory Visit | Attending: Physical Medicine and Rehabilitation | Admitting: Physical Medicine and Rehabilitation

## 2023-04-01 DIAGNOSIS — M533 Sacrococcygeal disorders, not elsewhere classified: Secondary | ICD-10-CM

## 2023-04-01 NOTE — Consult Note (Addendum)
 Chief Complaint: Back pain  Referring Physician(s): Wang,Hao  History of Present Illness: Sandy Summers is a 83 y.o. female presenting to [facility] with back pain, kindly referred by Dr. Regino Schultze, for evaluation of possible vertebral augmentation.   Sandy Summers is here today with her husband for evaluation.    Sandy Summers tells me today that the pain in her back started sometime in January of this year.  She did not have any falls or trauma.  She says she was walking just fine at Christmas.  One day she was walking ok, and the next day she was not.    She is currently ambulating with a walking cane.  While she is able to ambulate, she says every step is painful.  Again, this has been going on since January after her birthday on the [birth year].  She says the pain is 8/10 intensity.  Sharp quality.  She denies any associated bladder or bowel problems. She is currently taking tylenol #3, but this only partly works.  She previously was taking tramadol, but said that it would make her head feel funny/dizzy.  She does not perceive any weakness of the lower extremity, just the pain.    Her primary complaint is that she just wants to be able to walk her dogs -- Yorkies -- comfortably and safely, which she can no longer do.  This is not acceptable to her.   Her Roland-[employer]ris Disability questionnaire is total of 20/24.   She has previously had MRI performed 11/11/22, which shows a slight L4 deformity, but no edema to indicate acute fracture.   She has now had pelvic MRI performed that shows bilateral sacral insufficiency fracture. The L4 is unchanged when compared to the MRI.   CT 03/22/23    She denies any history of MI or stroke. She has had "heart stent" done, which was in the 90's.  She said this was not for heart attack.  She quit smoking 25 years ago or longer.   She is currently taking ASA   Past Medical History:  Diagnosis Date   Hyperlipidemia    Hypertension    Thyroid disease      Past Surgical History:  Procedure Laterality Date   CORONARY ANGIOPLASTY WITH STENT PLACEMENT  in 2002   HIP PINNING,CANNULATED Left 11/16/2020   Procedure: CANNULATED HIP PINNING;  Surgeon: Ernestina Columbia, MD;  Location: [facility];  Service: Orthopedics;  Laterality: Left;  synthes, cannulated screws, hanna table, large c-arm,    can go as early as 2 p.m.    Allergies: Alendronate, Amoxicillin-pot clavulanate, Denosumab, Gabapentin, Hydrocodone-acetaminophen, and Tramadol  Medications: Prior to Admission medications   Medication Sig Start Date End Date Taking? Authorizing Provider  acetaminophen (TYLENOL 8 HOUR) 650 MG CR tablet 1 tab po q 8 hours prn pain 03/28/23   Saguier, Ramon Dredge, PA-C  acetaminophen-codeine (TYLENOL #3) 300-30 MG tablet Take 1 tablet by mouth every 8 (eight) hours as needed for moderate pain (pain score 4-6). 03/25/23   Saguier, Ramon Dredge, PA-C  aspirin 81 MG chewable tablet Chew 81 mg by mouth daily.      [provider]  atorvastatin (LIPITOR) 20 MG tablet TAKE 1 TABLET BY MOUTH ONCE DAILY . APPOINTMENT REQUIRED FOR FUTURE REFILLS 03/20/23   Saguier, Ramon Dredge, PA-C  celecoxib (CELEBREX) 100 MG capsule Take 1 capsule (100 mg total) by mouth 2 (two) times daily. Patient not taking: Reported on 04/01/2023 01/28/23   Alfredia Ferguson, PA-C  Cholecalciferol 25 MCG (1000  UT) tablet Take 1,000 Units by mouth daily.    [provider]  cyclobenzaprine (FLEXERIL) 5 MG tablet Take 1 tablet (5 mg total) by mouth at bedtime. Patient not taking: Reported on 04/01/2023 01/28/23   Alfredia Ferguson, PA-C  Iron, Ferrous Sulfate, 325 (65 Fe) MG TABS Take 325 mg by mouth daily. 03/28/23   Saguier, Ramon Dredge, PA-C  levothyroxine (SYNTHROID) 50 MCG tablet Take 1 tablet by mouth once daily 03/04/23   Saguier, Ramon Dredge, PA-C  lidocaine (LIDODERM) 5 % Place 1 patch onto the skin daily. Remove & Discard patch within 12 hours or as directed by MD 03/15/23   Wynetta Fines, MD  lisinopril  (ZESTRIL) 5 MG tablet Take 1 tablet by mouth once daily 01/18/23   Saguier, Ramon Dredge, PA-C  methylPREDNISolone (MEDROL) 4 MG tablet Standard 6 day taper dose Patient not taking: Reported on 04/01/2023 02/11/23   Saguier, Ramon Dredge, PA-C  metoprolol succinate (TOPROL-XL) 50 MG 24 hr tablet TAKE 1 TABLET BY MOUTH AT BEDTIME 03/15/23   Saguier, Ramon Dredge, PA-C  Multiple Vitamins-Minerals (PRESERVISION AREDS 2 PO) Take 1 capsule by mouth daily.    [provider]  senna-docusate (SENOKOT-S) 8.6-50 MG tablet Take 1 tablet by mouth at bedtime as needed for mild constipation. 11/18/20   Jerald Kief, MD  traZODone (DESYREL) 50 MG tablet TAKE 1/2 TO 1 (ONE-HALF TO ONE) TABLET BY MOUTH ONCE DAILY AT BEDTIME 03/11/23   Saguier, Ramon Dredge, PA-C     No family history on file.  Social History   Socioeconomic History   Marital status: Married    Spouse name: Not on file   Number of children: Not on file   Years of education: Not on file   Highest education level: Not on file  Occupational History   Not on file  Tobacco Use   Smoking status: Former    Current packs/day: 0.00    Types: Cigarettes    Quit date: 10/06/1995    Years since quitting: 27.5   Smokeless tobacco: Never  Substance and Sexual Activity   Alcohol use: No   Drug use: No   Sexual activity: Not Currently  Other Topics Concern   Not on file  Social History Narrative   Not on file   Social Drivers of Health   Financial Resource Strain: Low Risk  (04/20/2021)   Overall Financial Resource Strain (CARDIA)    Difficulty of Paying Living Expenses: Not hard at all  Food Insecurity: No Food Insecurity (04/30/2022)   Hunger Vital Sign    Worried About Running Out of Food in the Last Year: Never true    Ran Out of Food in the Last Year: Never true  Transportation Needs: No Transportation Needs (04/30/2022)   PRAPARE - Administrator, Civil Service (Medical): No    Lack of Transportation (Non-Medical): No  Physical  Activity: Sufficiently Active (04/20/2021)   Exercise Vital Sign    Days of Exercise per Week: 7 days    Minutes of Exercise per Session: 30 min  Stress: No Stress Concern Present (04/20/2021)   [employer] of Occupational Health - Occupational Stress Questionnaire    Feeling of Stress : Not at all  Social Connections: Moderately Integrated (04/20/2021)   Social Connection and Isolation Panel [NHANES]    Frequency of Communication with Friends and Family: More than three times a week    Frequency of Social Gatherings with Friends and Family: More than three times a week    Attends Religious Services:  Never    Active Member of Clubs or Organizations: Yes    Attends Banker Meetings: More than 4 times per year    Marital Status: Married       Review of Systems: A 12 point ROS discussed and pertinent positives are indicated in the HPI above.  All other systems are negative.  Review of Systems  Vital Signs: BP (!) 144/75   Pulse 88   Temp 98.9 F (37.2 C) (Oral)   Resp 17   SpO2 94%       Physical Exam General: 83 yo female appearing stated age.  Well-developed, well-nourished.  No distress. HEENT: Atraumatic, normocephalic.  Glasses Conjugate gaze, extra-ocular motor intact. No scleral icterus or scleral injection. No lesions on external ears, nose, lips, or gums.  Oral mucosa moist, pink.  Neck: Symmetric with no goiter enlargement.  Chest/Lungs:  Symmetric chest with inspiration/expiration.  No labored breathing.  Clear to auscultation with no wheezes, rhonchi, or rales.  Heart:  RRR.  Systolic murmur.  No JVD appreciated.  Abdomen:  Soft, NT/ND, with + bowel sounds.   Genito-urinary: Deferred Neurologic: Alert & Oriented to person, place, and time.   Normal affect and insight.  Appropriate questions.  Moving all 4 extremities with gross sensory intact.  Pulse Exam:  Systolic murmur reflected to the bilateral neck.  MSK: no step off.  No crepitus.  Reproducible pain in the midline low back at the lumbosacral jxn  Mallampati Score:     Imaging: CT PELVIS WO CONTRAST Result Date: 03/25/2023 CLINICAL DATA:  Severe bilateral buttock pain for 3-4 months. Difficulty ambulating. EXAM: CT PELVIS WITHOUT CONTRAST TECHNIQUE: Multidetector CT imaging of the pelvis was performed following the standard protocol without intravenous contrast. RADIATION DOSE REDUCTION: This exam was performed according to the departmental dose-optimization program which includes automated exposure control, adjustment of the mA and/or kV according to patient size and/or use of iterative reconstruction technique. COMPARISON:  02/11/2023. FINDINGS: Urinary Tract:  No abnormality visualized. Bowel: Scattered diverticula are present along the colon without evidence of diverticulitis. Appendix appears normal. Vascular/Lymphatic: Aortic atherosclerosis. No pelvic lymphadenopathy is seen. Reproductive:  No mass or other significant abnormality Other:  No abdominopelvic ascites. Musculoskeletal: Fixation hardware is noted in the proximal left femur. Degenerative changes are noted in the lower lumbar spine. There are fractures of the sacrum bilaterally with sclerosis extending through all 3 zones with a step-off involving the mid sacrum. A chronic compression deformity is noted in the superior endplate at L4. IMPRESSION: 1. Extensive minimally displaced sacral insufficiency fracture. 2. 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 3. Diverticulosis without diverticulitis. 4. Aortic atherosclerosis. Electronically Signed   By: Thornell Sartorius M.D.   On: 03/25/2023 23:15    Labs:  CBC: Recent Labs    03/15/23 1143 03/25/23 1132  WBC 12.5* 7.6  HGB 10.8* 11.0*  HCT 35.3* 34.2*  PLT 347 540.0*    COAGS: No results for input(s): "INR", "APTT" in the last 8760 hours.  BMP: Recent Labs    09/17/22 0924 02/11/23 0843 03/15/23 1143 03/25/23 1132  NA 138 140 136 139  K 4.5 4.4 3.8 4.3   CL 104 103 100 100  CO2 24 26 23 26   GLUCOSE 85 72 111* 78  BUN 21 31* 15 15  CALCIUM 9.3 9.2 8.8* 9.1  CREATININE 0.91 0.93 0.78 0.77  GFRNONAA  --   --  >60  --     LIVER FUNCTION TESTS: Recent Labs    09/17/22  4098 02/11/23 0843 03/15/23 1143 03/25/23 1132  BILITOT 0.4 0.4 1.0 0.4  AST 29 23 18 16   ALT 25 20 16 13   ALKPHOS 64 100 124 125*  PROT 6.4 6.6 7.0 6.8  ALBUMIN 3.9 3.9 3.1* 3.9    TUMOR MARKERS: No results for input(s): "AFPTM", "CEA", "CA199", "CHROMGRNA" in the last 8760 hours.  Assessment and Plan:   Sandy Summers is 83 yo female with bilateral sacral insufficiency fractures, with life-style limiting, non-remitting pain of 8-10/10 intensity.    She has been affected by inability to participate comfortably in her ADL's, and is at risk for deconditioning, with disability on questionnaire of 20/24.   I had a lengthy discussion with her and her husband regarding the anatomy, pathology/pathophysiology, and the treatment options for sacral insufficiency fractures.  While conservative management is possible, her symptoms have been present since just after her birthday in January, and the pain is ongoing.    We discussed sacral augmentation with methylmethacrylate/cement AKA vertebroplasty as minimally invasive option for treatment.  The risk benefit is very favorable for treatment.  Although we cannot promise that we will take all of the pain away, we should improve to make her more comfortable.  Goals of therapy are to improve pain, with secondary goals of restore more comfortable activity level, and to decrease her need for strong pain medication.  She understands.   Risks and benefits of augmentation were discussed with the patient including, but not limited to education regarding the natural healing process of compression fractures without intervention, bleeding, infection, cement migration which may cause nerve injury/damage, paralysis, sedation risks, pulmonary  embolism, cardiopulmonary collapse, death.  After our discussion, she would like to proceed.   Plan: - Plan for bilateral sacral insufficiency fracture treatment with vertebral augmentation/sacral augmentation, with Dr. Loreta Ave, at [facility] - she will need to hold ASA for 5 days prior - continue medical care - recommend bone density testing if not already performed   Thank you for this interesting consult.  I greatly enjoyed meeting Sandy Summers and look forward to participating in their care.  A copy of this report was sent to the requesting provider on this date.  Electronically Signed: Gilmer Mor 04/01/2023, 3:38 PM   I spent a total of  60 Minutes   in face to face in clinical consultation, greater than 50% of which was counseling/coordinating care for bilateral sacral insufficiency fracture, possible cement augmentation.

## 2023-04-03 ENCOUNTER — Telehealth: Payer: Self-pay | Admitting: Emergency Medicine

## 2023-04-03 NOTE — Telephone Encounter (Signed)
 Copied from CRM [id number]. Topic: General - Other >> Apr 03, 2023  2:51 PM Fredrich Romans wrote: Reason for CRM: Patient will be having sacroplasty  due to  fractures in her sacrum. Theres no date set yet,but patient just wanted to let provider know,per providers request.

## 2023-04-04 ENCOUNTER — Other Ambulatory Visit: Payer: Self-pay | Admitting: Interventional Radiology

## 2023-04-04 ENCOUNTER — Other Ambulatory Visit (HOSPITAL_BASED_OUTPATIENT_CLINIC_OR_DEPARTMENT_OTHER)

## 2023-04-04 DIAGNOSIS — M533 Sacrococcygeal disorders, not elsewhere classified: Secondary | ICD-10-CM

## 2023-04-04 NOTE — H&P (Incomplete)
 Chief Complaint: Patient was seen in consultation today for back pain   Referring Physician(s): Claria Dice  Supervising Physician: Gilmer Mor  Patient Status: [facility] Sandy Summers - Out patient   History of Present Illness: Sandy Summers is a 83 y.o. female who was referred to Interventional Radiology for treatment/management options for back pain related to bilateral sacral insufficiency fracture identified on CT pelvis 03/22/23. She met with Dr. Loreta Ave 04/01/23 and she reported that her back pain started January 2025. She described ambulation as being painful - an 8/10 - and that pain medication (Tylenol #3) is only partially effective. She is unable to participate comfortably in her activities of daily living.   Dr. Loreta Ave had a lengthy discussion with her and her husband regarding the anatomy, pathology/pathophysiology, and the treatment options for sacral insufficiency fractures. While conservative management is possible, her symptoms have been present since just after her birthday in January, and the pain is ongoing.   They discussed sacral augmentation with methylmethacrylate/cement AKA vertebroplasty as minimally invasive option for treatment. The risk benefit was considered very favorable for treatment. Goals of therapy were to improve pain, with secondary goals of restoring a more comfortable activity level, and to decrease her need for strong pain medication.   Risks and benefits of augmentation were discussed with the patient including, but not limited to education regarding the natural healing process of compression fractures without intervention, bleeding, infection, cement migration which may cause nerve injury/damage, paralysis, sedation risks, pulmonary embolism, cardiopulmonary collapse, death   She verbalized understanding and expressed a desire to proceed.    Past Medical History:  Diagnosis Date   Hyperlipidemia    Hypertension    Thyroid disease     Past  Surgical History:  Procedure Laterality Date   CORONARY ANGIOPLASTY WITH STENT PLACEMENT  in 2002   HIP PINNING,CANNULATED Left 11/16/2020   Procedure: CANNULATED HIP PINNING;  Surgeon: Ernestina Columbia, MD;  Location: [facility];  Service: Orthopedics;  Laterality: Left;  synthes, cannulated screws, hanna table, large c-arm,    can go as early as 2 p.m.    Allergies: Alendronate, Amoxicillin-pot clavulanate, Denosumab, Gabapentin, Hydrocodone-acetaminophen, and Tramadol  Medications: Prior to Admission medications   Medication Sig Start Date End Date Taking? Authorizing Provider  acetaminophen (TYLENOL 8 HOUR) 650 MG CR tablet 1 tab po q 8 hours prn pain 03/28/23   Saguier, Ramon Dredge, PA-C  acetaminophen-codeine (TYLENOL #3) 300-30 MG tablet Take 1 tablet by mouth every 8 (eight) hours as needed for moderate pain (pain score 4-6). 03/25/23   Saguier, Ramon Dredge, PA-C  aspirin 81 MG chewable tablet Chew 81 mg by mouth daily.      [provider]  atorvastatin (LIPITOR) 20 MG tablet TAKE 1 TABLET BY MOUTH ONCE DAILY . APPOINTMENT REQUIRED FOR FUTURE REFILLS 03/20/23   Saguier, Ramon Dredge, PA-C  celecoxib (CELEBREX) 100 MG capsule Take 1 capsule (100 mg total) by mouth 2 (two) times daily. Patient not taking: Reported on 04/01/2023 01/28/23   Alfredia Ferguson, PA-C  Cholecalciferol 25 MCG (1000 UT) tablet Take 1,000 Units by mouth daily.    [provider]  cyclobenzaprine (FLEXERIL) 5 MG tablet Take 1 tablet (5 mg total) by mouth at bedtime. Patient not taking: Reported on 04/01/2023 01/28/23   Alfredia Ferguson, PA-C  Iron, Ferrous Sulfate, 325 (65 Fe) MG TABS Take 325 mg by mouth daily. 03/28/23   Saguier, Ramon Dredge, PA-C  levothyroxine (SYNTHROID) 50 MCG tablet Take 1 tablet by mouth once daily 03/04/23  Saguier, Ramon Dredge, PA-C  lidocaine (LIDODERM) 5 % Place 1 patch onto the skin daily. Remove & Discard patch within 12 hours or as directed by MD 03/15/23   Wynetta Fines, MD  lisinopril (ZESTRIL) 5 MG  tablet Take 1 tablet by mouth once daily 01/18/23   Saguier, Ramon Dredge, PA-C  methylPREDNISolone (MEDROL) 4 MG tablet Standard 6 day taper dose Patient not taking: Reported on 04/01/2023 02/11/23   Saguier, Ramon Dredge, PA-C  metoprolol succinate (TOPROL-XL) 50 MG 24 hr tablet TAKE 1 TABLET BY MOUTH AT BEDTIME 03/15/23   Saguier, Ramon Dredge, PA-C  Multiple Vitamins-Minerals (PRESERVISION AREDS 2 PO) Take 1 capsule by mouth daily.    [provider]  senna-docusate (SENOKOT-S) 8.6-50 MG tablet Take 1 tablet by mouth at bedtime as needed for mild constipation. 11/18/20   Jerald Kief, MD  traZODone (DESYREL) 50 MG tablet TAKE 1/2 TO 1 (ONE-HALF TO ONE) TABLET BY MOUTH ONCE DAILY AT BEDTIME 03/11/23   Saguier, Ramon Dredge, PA-C     No family history on file.  Social History   Socioeconomic History   Marital status: Married    Spouse name: Not on file   Number of children: Not on file   Years of education: Not on file   Highest education level: Not on file  Occupational History   Not on file  Tobacco Use   Smoking status: Former    Current packs/day: 0.00    Types: Cigarettes    Quit date: 10/06/1995    Years since quitting: 27.5   Smokeless tobacco: Never  Substance and Sexual Activity   Alcohol use: No   Drug use: No   Sexual activity: Not Currently  Other Topics Concern   Not on file  Social History Narrative   Not on file   Social Drivers of Health   Financial Resource Strain: Low Risk  (04/20/2021)   Overall Financial Resource Strain (CARDIA)    Difficulty of Paying Living Expenses: Not hard at all  Food Insecurity: No Food Insecurity (04/30/2022)   Hunger Vital Sign    Worried About Running Out of Food in the Last Year: Never true    Ran Out of Food in the Last Year: Never true  Transportation Needs: No Transportation Needs (04/30/2022)   PRAPARE - Administrator, Civil Service (Medical): No    Lack of Transportation (Non-Medical): No  Physical Activity: Sufficiently  Active (04/20/2021)   Exercise Vital Sign    Days of Exercise per Week: 7 days    Minutes of Exercise per Session: 30 min  Stress: No Stress Concern Present (04/20/2021)   [employer] of Occupational Health - Occupational Stress Questionnaire    Feeling of Stress : Not at all  Social Connections: Moderately Integrated (04/20/2021)   Social Connection and Isolation Panel [NHANES]    Frequency of Communication with Friends and Family: More than three times a week    Frequency of Social Gatherings with Friends and Family: More than three times a week    Attends Religious Services: Never    Database administrator or Organizations: Yes    Attends Engineer, structural: More than 4 times per year    Marital Status: Married    Review of Systems: A 12 point ROS discussed and pertinent positives are indicated in the HPI above.  All other systems are negative.  Review of Systems  Vital Signs: There were no vitals taken for this visit.  Physical Exam  Imaging: CT PELVIS  WO CONTRAST Result Date: 03/25/2023 CLINICAL DATA:  Severe bilateral buttock pain for 3-4 months. Difficulty ambulating. EXAM: CT PELVIS WITHOUT CONTRAST TECHNIQUE: Multidetector CT imaging of the pelvis was performed following the standard protocol without intravenous contrast. RADIATION DOSE REDUCTION: This exam was performed according to the departmental dose-optimization program which includes automated exposure control, adjustment of the mA and/or kV according to patient size and/or use of iterative reconstruction technique. COMPARISON:  02/11/2023. FINDINGS: Urinary Tract:  No abnormality visualized. Bowel: Scattered diverticula are present along the colon without evidence of diverticulitis. Appendix appears normal. Vascular/Lymphatic: Aortic atherosclerosis. No pelvic lymphadenopathy is seen. Reproductive:  No mass or other significant abnormality Other:  No abdominopelvic ascites. Musculoskeletal: Fixation hardware  is noted in the proximal left femur. Degenerative changes are noted in the lower lumbar spine. There are fractures of the sacrum bilaterally with sclerosis extending through all 3 zones with a step-off involving the mid sacrum. A chronic compression deformity is noted in the superior endplate at L4. IMPRESSION: 1. Extensive minimally displaced sacral insufficiency fracture. 2. 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 3. Diverticulosis without diverticulitis. 4. Aortic atherosclerosis. Electronically Signed   By: Thornell Sartorius M.D.   On: 03/25/2023 23:15    Labs:  CBC: Recent Labs    03/15/23 1143 03/25/23 1132  WBC 12.5* 7.6  HGB 10.8* 11.0*  HCT 35.3* 34.2*  PLT 347 540.0*    COAGS: No results for input(s): "INR", "APTT" in the last 8760 hours.  BMP: Recent Labs    09/17/22 0924 02/11/23 0843 03/15/23 1143 03/25/23 1132  NA 138 140 136 139  K 4.5 4.4 3.8 4.3  CL 104 103 100 100  CO2 24 26 23 26   GLUCOSE 85 72 111* 78  BUN 21 31* 15 15  CALCIUM 9.3 9.2 8.8* 9.1  CREATININE 0.91 0.93 0.78 0.77  GFRNONAA  --   --  >60  --     LIVER FUNCTION TESTS: Recent Labs    09/17/22 0924 02/11/23 0843 03/15/23 1143 03/25/23 1132  BILITOT 0.4 0.4 1.0 0.4  AST 29 23 18 16   ALT 25 20 16 13   ALKPHOS 64 100 124 125*  PROT 6.4 6.6 7.0 6.8  ALBUMIN 3.9 3.9 3.1* 3.9    TUMOR MARKERS: No results for input(s): "AFPTM", "CEA", "CA199", "CHROMGRNA" in the last 8760 hours.  Assessment and Plan:  ***  Thank you for this interesting consult.  I greatly enjoyed meeting MANETTE DOTO and look forward to participating in their care.  A copy of this report was sent to the requesting provider on this date.  Electronically Signed: Alwyn Ren, AGACNP-BC 04/04/2023, 7:19 PM   I spent a total of {New UEAV:[id number]} {New Out-Pt:[id number]}  {Established Out-Pt:[id number]} in face to face in clinical consultation, greater than 50% of which was counseling/coordinating care for ***

## 2023-04-05 ENCOUNTER — Ambulatory Visit
Admission: RE | Admit: 2023-04-05 | Discharge: 2023-04-05 | Disposition: A | Discharge status: Home / Self Care | Source: Ambulatory Visit | Attending: Interventional Radiology | Admitting: Interventional Radiology

## 2023-04-05 ENCOUNTER — Other Ambulatory Visit: Payer: Self-pay | Admitting: Medical

## 2023-04-05 DIAGNOSIS — M533 Sacrococcygeal disorders, not elsewhere classified: Secondary | ICD-10-CM

## 2023-04-05 MED ORDER — MIDAZOLAM HCL 2 MG/2ML IJ SOLN
1.0000 mg | INTRAMUSCULAR | Status: DC | PRN
Start: 2023-04-05 — End: 2023-04-06

## 2023-04-05 MED ORDER — FENTANYL CITRATE PF 50 MCG/ML IJ SOSY: 25.0000 ug | PREFILLED_SYRINGE | INTRAMUSCULAR | Status: DC | PRN

## 2023-04-16 ENCOUNTER — Other Ambulatory Visit: Payer: Self-pay | Admitting: Medical

## 2023-04-17 ENCOUNTER — Other Ambulatory Visit: Payer: Self-pay | Admitting: Medical

## 2023-04-18 NOTE — Discharge Instructions (Signed)
 Sacroplasty Post Procedure Discharge Instructions  May resume a regular diet and any medications that you routinely take (including pain medications). No driving day of procedure. Upon discharge go home and rest for at least 4 hours.  May use an ice pack as needed to injection sites on back.  Ice to back 30 minutes on and 30 minutes off, all day. May remove bandaids tomorrow after taking a shower. Replace daily with clean bandaid until healed. Do not lift anything heavier than a milk jug. Follow up with your attending physician in 2 weeks.  Please contact our office at [phone] for the following symptoms:  Fever greater than 100 degrees Increased swelling, pain, or redness at injection site.   If you need to speak to someone after hours (5:00pm) please contact the on-call IR MD at [phone]. Tell them you are a patient of Dr. Marne Sings, you had a sacroplasty today and any issues you are experiencing.    Thank you for visiting [facility]!

## 2023-04-18 NOTE — Progress Notes (Signed)
 Reminder call to [name] regarding her scheduled sacroplasty appointment on 04/19/23. Instructions given on NPO status, what to expect pre, intra, post procedure, advised her to wear comfortable clothing, we reviewed her allergies and medications reminding her not to take any muscle relaxers, pain medication, or anti-anxiety medications,and she stated her husband and daughter will be accompanying her. Time allowed to ask questions [name] verbalized understanding.

## 2023-04-19 ENCOUNTER — Ambulatory Visit
Admission: RE | Admit: 2023-04-19 | Discharge: 2023-04-19 | Disposition: A | Discharge status: Home / Self Care | Source: Ambulatory Visit | Attending: Interventional Radiology | Admitting: Interventional Radiology

## 2023-04-19 DIAGNOSIS — M8008XA Age-related osteoporosis with current pathological fracture, vertebra(e), initial encounter for fracture: Secondary | ICD-10-CM | POA: Diagnosis not present

## 2023-04-19 HISTORY — PX: IR VERTEBROPLASTY LUMBAR BX INC UNI/BIL INC/INJECT/IMAGING: IMG5516

## 2023-04-19 MED ORDER — VANCOMYCIN HCL IN DEXTROSE 1-5 GM/200ML-% IV SOLN
1000.0000 mg | INTRAVENOUS | Status: AC
  Administered 2023-04-19: 1000 mg via INTRAVENOUS

## 2023-04-19 MED ORDER — FENTANYL CITRATE PF 50 MCG/ML IJ SOSY
25.0000 ug | PREFILLED_SYRINGE | INTRAMUSCULAR | Status: DC | PRN
  Administered 2023-04-19 (×2): 50 ug via INTRAVENOUS

## 2023-04-19 MED ORDER — MIDAZOLAM HCL 2 MG/2ML IJ SOLN
1.0000 mg | INTRAMUSCULAR | Status: DC | PRN
  Administered 2023-04-19 (×2): 1 mg via INTRAVENOUS

## 2023-04-19 MED ORDER — LIDOCAINE-EPINEPHRINE 1 %-1:100000 IJ SOLN
20.0000 mL | Freq: Once | INTRAMUSCULAR | Status: AC
  Administered 2023-04-19: 20 mL via INTRADERMAL

## 2023-04-19 MED ORDER — SODIUM CHLORIDE 0.9 % IV SOLN: INTRAVENOUS | Status: DC

## 2023-04-21 ENCOUNTER — Other Ambulatory Visit: Payer: Self-pay | Admitting: Medical

## 2023-04-30 ENCOUNTER — Ambulatory Visit: Admitting: Medical

## 2023-05-01 ENCOUNTER — Encounter: Payer: Self-pay | Admitting: Medical

## 2023-05-01 ENCOUNTER — Ambulatory Visit (INDEPENDENT_AMBULATORY_CARE_PROVIDER_SITE_OTHER): Admitting: Medical

## 2023-05-01 VITALS — BP 160/80 | HR 81 | Resp 18 | Ht <= 58 in | Wt 109.0 lb

## 2023-05-01 DIAGNOSIS — D649 Anemia, unspecified: Secondary | ICD-10-CM | POA: Diagnosis not present

## 2023-05-01 DIAGNOSIS — K921 Melena: Secondary | ICD-10-CM | POA: Diagnosis not present

## 2023-05-01 DIAGNOSIS — S32040A Wedge compression fracture of fourth lumbar vertebra, initial encounter for closed fracture: Secondary | ICD-10-CM | POA: Diagnosis not present

## 2023-05-01 DIAGNOSIS — M810 Age-related osteoporosis without current pathological fracture: Secondary | ICD-10-CM | POA: Diagnosis not present

## 2023-05-01 LAB — VITAMIN D 25 HYDROXY (VIT D DEFICIENCY, FRACTURES): VITD: 42.87 ng/mL (ref 30.00–100.00)

## 2023-05-01 LAB — CBC WITH DIFFERENTIAL/PLATELET
Basophils Absolute: 0 10*3/uL (ref 0.0–0.1)
Basophils Relative: 0.8 % (ref 0.0–3.0)
Eosinophils Absolute: 0.3 10*3/uL (ref 0.0–0.7)
Eosinophils Relative: 5 % (ref 0.0–5.0)
HCT: 33.5 % — ABNORMAL LOW (ref 36.0–46.0)
Hemoglobin: 10.6 g/dL — ABNORMAL LOW (ref 12.0–15.0)
Lymphocytes Relative: 11.9 % — ABNORMAL LOW (ref 12.0–46.0)
Lymphs Abs: 0.7 10*3/uL (ref 0.7–4.0)
MCHC: 31.7 g/dL (ref 30.0–36.0)
MCV: 87.1 fl (ref 78.0–100.0)
Monocytes Absolute: 0.6 10*3/uL (ref 0.1–1.0)
Monocytes Relative: 9.7 % (ref 3.0–12.0)
Neutro Abs: 4.2 10*3/uL (ref 1.4–7.7)
Neutrophils Relative %: 72.6 % (ref 43.0–77.0)
Platelets: 361 10*3/uL (ref 150.0–400.0)
RBC: 3.84 Mil/uL — ABNORMAL LOW (ref 3.87–5.11)
RDW: 18.4 % — ABNORMAL HIGH (ref 11.5–15.5)
WBC: 5.8 10*3/uL (ref 4.0–10.5)

## 2023-05-01 LAB — IRON,TIBC AND FERRITIN PANEL
%SAT: 13 % — ABNORMAL LOW (ref 16–45)
Ferritin: 25 ng/mL (ref 16–288)
Iron: 35 ug/dL — ABNORMAL LOW (ref 45–160)
TIBC: 268 ug/dL (ref 250–450)

## 2023-05-01 MED ORDER — LOSARTAN POTASSIUM 50 MG PO TABS
50.0000 mg | ORAL_TABLET | Freq: Every day | ORAL | 3 refills | Status: AC
Start: 2023-05-01 — End: ?

## 2023-05-01 NOTE — Progress Notes (Signed)
 Subjective:    Patient ID: Sandy Summers, female    DOB: Dec 17, 1940, 83 y.o.   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 Tylenol  with codeine  effective for pain management with fewer cognitive effects than tramadol. - Continue Tylenol  650 mg during the day, one tablet in the morning and one in the afternoon. - Use Tylenol  with codeine  at night. - Avoid taking Tylenol  650 mg and Tylenol  with codeine  together. - Update by Tuesday regarding contact from the pain specialist. - Consider referral to a neurosurgeon if no plan is in place by the pain specialist.   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  Kyphoplasty as a potential treatment. - Await contact from the pain specialist regarding further management. - Consider referral to a neurosurgeon for potential kyphoplasty if no plan is in place by the pain specialist.   Osteoporosis Osteoporosis with previous bisphosphonate therapy discontinued. DEXA scan in 2023 discussed for reassessment. - Order DEXA scan to assess current bone density. - Consider starting a tablet medication for bone density after DEXA scan results.   Anemia Anemia with stable levels but low iron . Investigate potential causes of low iron , including gastrointestinal bleeding. - Order stool test to check for blood. - Prescribe over-the-counter iron  tablet, one tablet daily for one month. - Follow up in one month to recheck iron  level and CBC anemia level."  Sandy Summers is an 83 year old female with osteoporosis who presents for follow-up of back pain and iron  deficiency anemia.  She has experienced significant improvement in her chronic hip and low back pain following a kyphoplasty procedure performed two weeks ago for an L4 compression fracture. She describes  a sensation of a 'flaw' when walking but denies any real pain, stating it does not prevent her from walking or functioning. She is able to sleep without pain.  She has been experiencing issues with her iron  supplementation, which she started approximately a month ago for iron  deficiency anemia. She reports nausea and diarrhea after taking the iron  pills, leading her to stop them two days ago. She experienced diarrhea for one day with a couple of loose stools. She has not experienced constipation, which is a common side effect of iron  supplementation.  Her past medical history includes osteoporosis, for which she has previously been on medications like alendronate and Prolia, but she experienced nausea with these treatments. She has not had a DEXA scan recently due to scheduling issues, as her last scan was in 2023. No current pain, constipation, or significant side effects from current medications.  Htn. bp high. No neuro or cardio signs or symptoms. on lisinopril  5 mg daily and metoprolol . last 3 office visit bp has been high.   Review of Systems  Constitutional:  Negative for chills, fatigue and fever.  Respiratory:  Negative for cough, chest tightness and wheezing.   Cardiovascular:  Negative for chest pain and palpitations.  Gastrointestinal:  Negative for abdominal pain and constipation.  Genitourinary:  Negative for dysuria, frequency and urgency.  Musculoskeletal:  Negative for back pain.       See hpi  Neurological:  Negative for dizziness, weakness and light-headedness.  Hematological:  Negative for adenopathy. Does not bruise/bleed easily.  Psychiatric/Behavioral:  Negative for behavioral problems and confusion.    Past Medical  History:  Diagnosis Date   Hyperlipidemia    Hypertension    Thyroid  disease      Social History   Socioeconomic History   Marital status: Married    Spouse name: Not on file   Number of children: Not on file   Years of education: Not on file    Highest education level: Not on file  Occupational History   Not on file  Tobacco Use   Smoking status: Former    Current packs/day: 0.00    Types: Cigarettes    Quit date: 10/06/1995    Years since quitting: 27.5   Smokeless tobacco: Never  Substance and Sexual Activity   Alcohol  use: No   Drug use: No   Sexual activity: Not Currently  Other Topics Concern   Not on file  Social History Narrative   Not on file   Social Drivers of Health   Financial Resource Strain: Low Risk  (04/20/2021)   Overall Financial Resource Strain (CARDIA)    Difficulty of Paying Living Expenses: Not hard at all  Food Insecurity: No Food Insecurity (04/30/2022)   Hunger Vital Sign    Worried About Running Out of Food in the Last Year: Never true    Ran Out of Food in the Last Year: Never true  Transportation Needs: No Transportation Needs (04/30/2022)   PRAPARE - Administrator, Civil Service (Medical): No    Lack of Transportation (Non-Medical): No  Physical Activity: Sufficiently Active (04/20/2021)   Exercise Vital Sign    Days of Exercise per Week: 7 days    Minutes of Exercise per Session: 30 min  Stress: No Stress Concern Present (04/20/2021)   [employer] of Occupational Health - Occupational Stress Questionnaire    Feeling of Stress : Not at all  Social Connections: Moderately Integrated (04/20/2021)   Social Connection and Isolation Panel [NHANES]    Frequency of Communication with Friends and Family: More than three times a week    Frequency of Social Gatherings with Friends and Family: More than three times a week    Attends Religious Services: Never    Database administrator or Organizations: Yes    Attends Engineer, structural: More than 4 times per year    Marital Status: Married  Catering manager Violence: Not At Risk (04/30/2022)   Humiliation, Afraid, Rape, and Kick questionnaire    Fear of Current or Ex-Partner: No    Emotionally Abused: No    Physically  Abused: No    Sexually Abused: No    Past Surgical History:  Procedure Laterality Date   CORONARY ANGIOPLASTY WITH STENT PLACEMENT  in 2002   HIP PINNING,CANNULATED Left 11/16/2020   Procedure: CANNULATED HIP PINNING;  Surgeon: Bettyjane Brunet, MD;  Location: [facility];  Service: Orthopedics;  Laterality: Left;  synthes, cannulated screws, hanna table, large c-arm,    can go as early as 2 p.m.   IR VERTEBROPLASTY LUMBAR BX INC UNI/BIL INC/INJECT/IMAGING  04/19/2023    No family history on file.  Allergies  Allergen Reactions   Alendronate Nausea Only   Amoxicillin-Pot Clavulanate Diarrhea   Denosumab Other (See Comments)    I just didn't like it    Gabapentin Other (See Comments)    Ineffective for sleep "made me crazy"   Hydrocodone -Acetaminophen  Other (See Comments)   Tramadol Other (See Comments)    Delirium     Current Outpatient Medications on File Prior to Visit  Medication Sig Dispense Refill  acetaminophen  (TYLENOL  8 HOUR) 650 MG CR tablet 1 tab po q 8 hours prn pain 30 tablet 1   acetaminophen -codeine  (TYLENOL  #3) 300-30 MG tablet Take 1 tablet by mouth every 8 (eight) hours as needed for moderate pain (pain score 4-6). 10 tablet 0   aspirin 81 MG chewable tablet Chew 81 mg by mouth daily.       atorvastatin  (LIPITOR) 20 MG tablet TAKE 1 TABLET BY MOUTH ONCE DAILY . APPOINTMENT REQUIRED FOR FUTURE REFILLS 30 tablet 0   celecoxib  (CELEBREX ) 100 MG capsule Take 1 capsule (100 mg total) by mouth 2 (two) times daily. (Patient not taking: Reported on 04/01/2023) 30 capsule 0   Cholecalciferol  25 MCG (1000 UT) tablet Take 1,000 Units by mouth daily.     cyclobenzaprine  (FLEXERIL ) 5 MG tablet Take 1 tablet (5 mg total) by mouth at bedtime. (Patient not taking: Reported on 04/01/2023) 30 tablet 0   levothyroxine  (SYNTHROID ) 50 MCG tablet Take 1 tablet by mouth once daily 90 tablet 0   lidocaine  (LIDODERM ) 5 % Place 1 patch onto the skin daily. Remove & Discard patch within 12 hours  or as directed by MD 30 patch 0   methylPREDNISolone  (MEDROL ) 4 MG tablet Standard 6 day taper dose (Patient not taking: Reported on 04/01/2023) 21 tablet 0   metoprolol  succinate (TOPROL -XL) 50 MG 24 hr tablet TAKE 1 TABLET BY MOUTH AT BEDTIME 90 tablet 0   Multiple Vitamins-Minerals (PRESERVISION AREDS 2 PO) Take 1 capsule by mouth daily.     senna-docusate (SENOKOT-S) 8.6-50 MG tablet Take 1 tablet by mouth at bedtime as needed for mild constipation. 30 tablet 0   SV IRON  325 (65 Fe) MG tablet Take 1 tablet by mouth once daily 30 tablet 0   traZODone  (DESYREL ) 50 MG tablet TAKE 1/2 TO 1 (ONE-HALF TO ONE) TABLET BY MOUTH EVERY DAY AT BEDTIME 30 tablet 0   No current facility-administered medications on file prior to visit.    BP (!) 160/80   Pulse 81   Resp 18   Ht 4\' 10"  (1.473 m)   Wt 109 lb (49.4 kg)   SpO2 98%   BMI 22.78 kg/m          Objective:   Physical Exam  General- No acute distress. Pleasant patient. Neck- Full range of motion, no jvd Lungs- Clear, even and unlabored. Heart- regular rate and rhythm. Neurologic- CNII- XII grossly intact.       Assessment & Plan:   Compression fracture of L4 Post-kyphoplasty with significant pain improvement, slight discomfort when walking. - Advise maintaining current activity level, avoid excessive strain.  Osteoporosis Osteoporosis with intolerance to alendronate and Prolia. Recent kyphoplasty likely related to osteoporosis. Focus on non-pharmacological measures. - Check vitamin D level. - Encourage sun exposure, weight-bearing exercises. - Schedule DEXA scan in November.  Iron  deficiency anemia Mild anemia with low iron  levels, intolerance to supplements. Possible dietary insufficiency or GI blood loss. Incomplete stool test for occult blood. - Order repeat anemia panel. - Instruct to complete stool test for occult blood. - Discuss dietary modifications to increase iron  intake.  Htn -not controlled.  -dc  lisninopril. Start losartan. Continue metoprolol .  Follow up in 2 weeks or sooner if needed

## 2023-05-01 NOTE — Patient Instructions (Signed)
 Compression fracture of L4 Post-kyphoplasty with significant pain improvement, slight discomfort when walking. - Advise maintaining current activity level, avoid excessive strain.  Osteoporosis Osteoporosis with intolerance to alendronate and Prolia. Recent kyphoplasty likely related to osteoporosis. Focus on non-pharmacological measures. - Check vitamin D level. - Encourage sun exposure, weight-bearing exercises. - Schedule DEXA scan in November.  Iron  deficiency anemia Mild anemia with low iron  levels, intolerance to supplements. Possible dietary insufficiency or GI blood loss. Incomplete stool test for occult blood. - Order repeat anemia panel. - Instruct to complete stool test for occult blood. - Discuss dietary modifications to increase iron  intake.  Htn -not controlled.  -dc lisninopril. Start losartan. Continue metoprolol .  Follow up in 2 weeks or sooner if needed

## 2023-05-05 ENCOUNTER — Other Ambulatory Visit: Payer: Self-pay | Admitting: Medical

## 2023-05-06 ENCOUNTER — Other Ambulatory Visit (INDEPENDENT_AMBULATORY_CARE_PROVIDER_SITE_OTHER)

## 2023-05-06 DIAGNOSIS — D649 Anemia, unspecified: Secondary | ICD-10-CM

## 2023-05-07 LAB — FECAL OCCULT BLOOD, IMMUNOCHEMICAL: Fecal Occult Bld: POSITIVE — AB

## 2023-05-08 ENCOUNTER — Encounter: Payer: Self-pay | Admitting: Medical

## 2023-05-08 NOTE — Addendum Note (Signed)
 Addended by: Serafina Damme on: 05/08/2023 06:00 AM   Modules accepted: Orders

## 2023-05-09 ENCOUNTER — Ambulatory Visit: Admitting: Physician Assistant

## 2023-05-09 ENCOUNTER — Ambulatory Visit

## 2023-05-09 ENCOUNTER — Encounter: Payer: Self-pay | Admitting: Physician Assistant

## 2023-05-09 VITALS — BP 150/62 | HR 93 | Ht 59.0 in | Wt 109.0 lb

## 2023-05-09 DIAGNOSIS — D509 Iron deficiency anemia, unspecified: Secondary | ICD-10-CM | POA: Diagnosis not present

## 2023-05-09 DIAGNOSIS — Z87891 Personal history of nicotine dependence: Secondary | ICD-10-CM

## 2023-05-09 DIAGNOSIS — I350 Nonrheumatic aortic (valve) stenosis: Secondary | ICD-10-CM | POA: Diagnosis not present

## 2023-05-09 DIAGNOSIS — R1084 Generalized abdominal pain: Secondary | ICD-10-CM

## 2023-05-09 DIAGNOSIS — R195 Other fecal abnormalities: Secondary | ICD-10-CM | POA: Diagnosis not present

## 2023-05-09 DIAGNOSIS — E538 Deficiency of other specified B group vitamins: Secondary | ICD-10-CM | POA: Diagnosis not present

## 2023-05-09 DIAGNOSIS — I2510 Atherosclerotic heart disease of native coronary artery without angina pectoris: Secondary | ICD-10-CM

## 2023-05-09 LAB — COMPREHENSIVE METABOLIC PANEL WITH GFR
ALT: 15 U/L (ref 0–35)
AST: 19 U/L (ref 0–37)
Albumin: 4 g/dL (ref 3.5–5.2)
Alkaline Phosphatase: 105 U/L (ref 39–117)
BUN: 18 mg/dL (ref 6–23)
CO2: 26 meq/L (ref 19–32)
Calcium: 9.3 mg/dL (ref 8.4–10.5)
Chloride: 102 meq/L (ref 96–112)
Creatinine, Ser: 0.95 mg/dL (ref 0.40–1.20)
GFR: 55.88 mL/min — ABNORMAL LOW (ref >60.00)
Glucose, Bld: 142 mg/dL — ABNORMAL HIGH (ref 70–99)
Potassium: 4.1 meq/L (ref 3.5–5.1)
Sodium: 139 meq/L (ref 135–145)
Total Bilirubin: 0.3 mg/dL (ref 0.2–1.2)
Total Protein: 7.4 g/dL (ref 6.0–8.3)

## 2023-05-09 LAB — CBC WITH DIFFERENTIAL/PLATELET
Basophils Absolute: 0 10*3/uL (ref 0.0–0.1)
Basophils Relative: 0.3 % (ref 0.0–3.0)
Eosinophils Absolute: 0.1 10*3/uL (ref 0.0–0.7)
Eosinophils Relative: 1.9 % (ref 0.0–5.0)
HCT: 35.5 % — ABNORMAL LOW (ref 36.0–46.0)
Hemoglobin: 11.4 g/dL — ABNORMAL LOW (ref 12.0–15.0)
Lymphocytes Relative: 17.8 % (ref 12.0–46.0)
Lymphs Abs: 1.4 10*3/uL (ref 0.7–4.0)
MCHC: 32.2 g/dL (ref 30.0–36.0)
MCV: 87.2 fl (ref 78.0–100.0)
Monocytes Absolute: 0.7 10*3/uL (ref 0.1–1.0)
Monocytes Relative: 8.4 % (ref 3.0–12.0)
Neutro Abs: 5.5 10*3/uL (ref 1.4–7.7)
Neutrophils Relative %: 71.6 % (ref 43.0–77.0)
Platelets: 396 10*3/uL (ref 150.0–400.0)
RBC: 4.07 Mil/uL (ref 3.87–5.11)
RDW: 18.9 % — ABNORMAL HIGH (ref 11.5–15.5)
WBC: 7.7 10*3/uL (ref 4.0–10.5)

## 2023-05-09 LAB — IBC + FERRITIN
Ferritin: 23.4 ng/mL (ref 10.0–291.0)
Iron: 140 ug/dL (ref 42–145)
Saturation Ratios: 41.8 % (ref 20.0–50.0)
TIBC: 334.6 ug/dL (ref 250.0–450.0)
Transferrin: 239 mg/dL (ref 212.0–360.0)

## 2023-05-09 LAB — VITAMIN B12: Vitamin B-12: 181 pg/mL — ABNORMAL LOW (ref 211–911)

## 2023-05-09 NOTE — Progress Notes (Addendum)
 05/09/2023 Sandy Summers [id number] [birth year]  Referring provider: Sylvia Everts, PA-C Primary GI doctor: Dr. Venice Gillis  ASSESSMENT AND PLAN:  IDA with positive FOBT 05/06/2023 05/01/2023  HGB 10.6 MCV 87.1 Platelets 361.0 05/01/2023 Iron  35 Ferritin 25 B12 191 Recent Labs    03/15/23 1143 03/25/23 1132 05/01/23 0838  HGB 10.8* 11.0* 10.6*  Remote colonoscopy 2008 I do not have the report Took iron  x 30 days but stopped taking due to oral intolerance, had diarrhea and felt bad but restarted with last labs 04/30 Denies overt GI bleeding, no NSAIDS, no ETOH No weight loss, no UGI symptoms, no change in Bm's other than diarrhea with iron , sound like may be more consistent with constipation in the past No family history of colon cancer Positive hemoccult test, last colonoscopy in 2008. -I had very candid discussion with the patient regards to iron  deficiency and while this can be from AVMs which can be fixed most concerning would be a colon cancer since her last colonoscopy was 2008, patient declines EGD or colonoscopy due to age and multiple comorbid conditions, she understands that we may be missing a cancer diagnosis and still declines. - Continue oral iron  supplementation. - Discuss option of IV iron  supplementation - Order CT scan to evaluate for potential GI bleeding sources. - Refer to hematology for supportive care and monitoring of iron  levels. 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 On low dose bASA, on statin No chest pain or SOB  Moderate aortic stenosis 10/13/2021 echocardiogram ejection fraction 60 to 65% mild to moderate aortic stenosis aortic valve peak pressure 30 mmHg, mean pressure gradient 16 mmHg, VTI  0.28 was on recall 1 to 2 years Going to new heart doctor tomorrow Walks without SOB, CP, no leg swelling No symptoms at this time Follow up with cardiology tomorrow  Pancolonic diverticulosis  B12 deficiency 2022 B12 below 200 Not on a B12 Will check - May be contributing to some of her symptoms she is currently having, consider parental injections if it is low enough  Remote history of elevated liver enzymes 08/2021 Check LFTs Get CT Ab and pelvis  Patient Care Team: Saguier, Slater Duncan as PCP - General (Internal Medicine) Belita Bowling, PA-C (Inactive) (Dermatology)  HISTORY OF PRESENT ILLNESS: 83 y.o. female with a past medical history listed below presents as a new patient for evaluation of anemia and positive FOBT.   Discussed the use of AI scribe software for clinical note transcription with the patient, who gave verbal consent to proceed.  History of Present Illness   Sandy Summers is an 83 year old female with iron  deficiency anemia who presents with intolerance to oral iron  supplements.  She has difficulty tolerating oral iron  supplements due to significant gastrointestinal side effects, including diarrhea, which led her to stop the medication for a week. Upon resuming the supplement, she has not experienced further adverse effects. She is exploring alternative forms of iron  supplementation.  No recent changes in bowel habits, though stools have been consistently small for years. She experienced constipation followed by diarrhea when she initially stopped the iron  supplement. No blood in her stool, though stools are black due to the iron  supplement.  She had a colonoscopy in 2008 and is reluctant to undergo another.  Her past medical history includes a stent placement in 2002 and mild to moderate aortic stenosis. No recent chest pain, shortness of breath, or swelling in her legs. She had an echocardiogram in 2023. She experienced significant back pain last  year, which was resolved with a sacroplasty after ineffective epidural steroid injections.  She is not currently on B12 supplementation despite a very low B12 level of 191 in April 2025. No symptoms such as numbness, tingling, or significant weight loss. She is not on any pain medications currently and denies recent use of Aleve or ibuprofen. She takes aspirin regularly.      She  reports that she quit smoking about 27 years ago. Her smoking use included cigarettes. She has never used smokeless tobacco. She reports that she does not drink alcohol  and does not use drugs.  RELEVANT GI HISTORY, IMAGING AND LABS: Results   LABS B12: 191 (04/2023)  RADIOLOGY CT pelvis: Sacral insufficiency fracture, compression (03/2023)  DIAGNOSTIC REPORTS Echocardiogram: Mild to moderate aortic stenosis (2023)      CBC    Component Value Date/Time   WBC 5.8 05/01/2023 0838   RBC 3.84 (L) 05/01/2023 0838   HGB 10.6 (L) 05/01/2023 0838   HCT 33.5 (L) 05/01/2023 0838   PLT 361.0 05/01/2023 0838   MCV 87.1 05/01/2023 0838   MCH 27.1 03/15/2023 1143   MCHC 31.7 05/01/2023 0838   RDW 18.4 (H) 05/01/2023 0838   LYMPHSABS 0.7 05/01/2023 0838   MONOABS 0.6 05/01/2023 0838   EOSABS 0.3 05/01/2023 0838   BASOSABS 0.0 05/01/2023 0838   Recent Labs    03/15/23 1143 03/25/23 1132 05/01/23 0838  HGB 10.8* 11.0* 10.6*    CMP     Component Value Date/Time   NA 139 03/25/2023 1132   K 4.3 03/25/2023 1132   CL 100 03/25/2023 1132   CO2 26 03/25/2023 1132   GLUCOSE 78 03/25/2023 1132   BUN 15 03/25/2023 1132   CREATININE 0.77 03/25/2023 1132   CALCIUM  9.1 03/25/2023 1132   PROT 6.8 03/25/2023 1132   ALBUMIN 3.9 03/25/2023 1132   AST 16 03/25/2023 1132   ALT 13 03/25/2023 1132   ALKPHOS 125 (H) 03/25/2023 1132   BILITOT 0.4 03/25/2023 1132   GFRNONAA >60 03/15/2023 1143      Latest Ref Rng & Units 03/25/2023   11:32 AM 03/15/2023   11:43 AM 02/11/2023    8:43 AM  Hepatic Function  Total  Protein 6.0 - 8.3 g/dL 6.8  7.0  6.6   Albumin 3.5 - 5.2 g/dL 3.9  3.1  3.9   AST 0 - 37 U/L 16  18  23    ALT 0 - 35 U/L 13  16  20    Alk Phosphatase 39 - 117 U/L 125  124  100   Total Bilirubin 0.2 - 1.2 mg/dL 0.4  1.0  0.4       Current Medications:   Current Outpatient Medications (Endocrine & Metabolic):    levothyroxine  (SYNTHROID ) 50 MCG tablet, Take 1 tablet by mouth once daily (Patient not taking: Reported on 05/09/2023)   methylPREDNISolone  (MEDROL ) 4 MG tablet, Standard 6 day taper dose (Patient not taking: Reported on 05/09/2023)  Current Outpatient Medications (Cardiovascular):    atorvastatin  (LIPITOR) 20 MG tablet, TAKE 1 TABLET BY MOUTH ONCE DAILY . APPOINTMENT REQUIRED FOR FUTURE REFILLS   metoprolol  succinate (TOPROL -XL) 50 MG 24 hr tablet, TAKE 1 TABLET  BY MOUTH AT BEDTIME   losartan  (COZAAR ) 50 MG tablet, Take 1 tablet (50 mg total) by mouth daily. (Patient not taking: Reported on 05/09/2023)   Current Outpatient Medications (Analgesics):    aspirin 81 MG chewable tablet, Chew 81 mg by mouth daily.     acetaminophen  (TYLENOL  8 HOUR) 650 MG CR tablet, 1 tab po q 8 hours prn pain (Patient not taking: Reported on 05/09/2023)   acetaminophen -codeine  (TYLENOL  #3) 300-30 MG tablet, Take 1 tablet by mouth every 8 (eight) hours as needed for moderate pain (pain score 4-6). (Patient not taking: Reported on 05/09/2023)   celecoxib  (CELEBREX ) 100 MG capsule, Take 1 capsule (100 mg total) by mouth 2 (two) times daily. (Patient not taking: Reported on 05/09/2023)  Current Outpatient Medications (Hematological):    SV IRON  325 (65 Fe) MG tablet, Take 1 tablet by mouth once daily  Current Outpatient Medications (Other):    Cholecalciferol  25 MCG (1000 UT) tablet, Take 1,000 Units by mouth daily.   cyclobenzaprine  (FLEXERIL ) 5 MG tablet, Take 1 tablet (5 mg total) by mouth at bedtime.   Multiple Vitamins-Minerals (PRESERVISION AREDS 2 PO), Take 1 capsule by mouth daily.   traZODone  (DESYREL )  50 MG tablet, TAKE 1/2 TO 1 (ONE-HALF TO ONE) TABLET BY MOUTH AT BEDTIME   lidocaine  (LIDODERM ) 5 %, Place 1 patch onto the skin daily. Remove & Discard patch within 12 hours or as directed by MD (Patient not taking: Reported on 05/09/2023)   senna-docusate (SENOKOT-S) 8.6-50 MG tablet, Take 1 tablet by mouth at bedtime as needed for mild constipation. (Patient not taking: Reported on 05/09/2023)  Medical History:  Past Medical History:  Diagnosis Date   Cholelithiasis    Hyperlipidemia    Hypertension    Thyroid  disease    Allergies:  Allergies  Allergen Reactions   Alendronate Nausea Only   Amoxicillin-Pot Clavulanate Diarrhea   Denosumab Other (See Comments)    I just didn't like it    Gabapentin Other (See Comments)    Ineffective for sleep "made me crazy"   Hydrocodone -Acetaminophen  Other (See Comments)   Tramadol Other (See Comments)    Delirium      Surgical History:  She  has a past surgical history that includes Coronary angioplasty with stent (in 2002); Hip pinning, cannulated (Left, 11/16/2020); and IR VERTEBROPLASTY LUMBAR BX INC UNI/BIL INC INJECT/IMAGING (04/19/2023). Family History:  Her family history is not on file.  REVIEW OF SYSTEMS  : All other systems reviewed and negative except where noted in the History of Present Illness.  PHYSICAL EXAM: BP (!) 150/62   Pulse 93   Ht 4\' 11"  (1.499 m)   Wt 109 lb (49.4 kg)   BMI 22.02 kg/m  Physical Exam   GENERAL APPEARANCE: elderly appearing, in no apparent distress. HEENT: No cervical lymphadenopathy, unremarkable thyroid , sclerae anicteric, conjunctiva pink. RESPIRATORY: Respiratory effort normal, breath sounds equal bilaterally without rales, rhonchi, or wheezing. CARDIO: Systolic murmur with regular rate and rhythm, peripheral pulses intact. ABDOMEN: Soft, non-distended, active bowel sounds in all four quadrants, non-tender to palpation, no rebound, no mass appreciated. RECTAL: Declines. MUSCULOSKELETAL: Full  range of motion, antalgic gait, slightly unstable able to get on table with assistance without edema. SKIN: Dry, intact without rashes or lesions. No jaundice. NEURO: Alert, oriented, no focal deficits. PSYCH: Cooperative, normal mood and affect.      Edmonia Gottron, PA-C 11:54 AM

## 2023-05-09 NOTE — Patient Instructions (Addendum)
 Your provider has requested that you go to the basement level for lab work before leaving today. Press "B" on the elevator. The lab is located at the first door on the left as you exit the elevator.  VISIT SUMMARY:  Today, we discussed your iron  deficiency anemia and intolerance to oral iron  supplements, as well as your very low Vitamin B12 levels. We also reviewed your history of aortic stenosis and coronary artery stent placement. We talked about your goals of care and the importance of follow-up for monitoring your iron  and B12 levels.  YOUR PLAN:  -IRON  DEFICIENCY ANEMIA: Iron  deficiency anemia is a condition where your body lacks enough iron  to produce healthy red blood cells. Since you have difficulty tolerating oral iron  supplements due to gastrointestinal side effects, we discussed the option of intravenous (IV) iron  supplementation. We will continue with your current oral iron  supplements for now, but we will also order a CT scan to check for any potential sources of gastrointestinal bleeding. You will be referred to a hematologist for further care and monitoring of your iron  levels. Please watch for symptoms such as black stools, abdominal pain, nausea, or changes in bowel habits.  -VITAMIN B12 DEFICIENCY: Vitamin B12 deficiency can cause weakness, fatigue, and neurological symptoms. Your B12 level was very low in April. We will recheck your B12 levels and start supplementation, possibly with injections if your levels remain low. You will also be referred to a hematologist for further monitoring and management of your B12 levels.  -AORTIC STENOSIS: Aortic stenosis is a condition where the valve between the heart and the aorta narrows, which can affect blood flow. Your condition is mild to moderate and currently well-managed with no recent symptoms.  -GOALS OF CARE: You have expressed a preference for focusing on quality of life and are not interested in aggressive treatments if cancer is  confirmed. You also want to ensure that your pets are cared for. We will keep these goals in mind as we plan your care.  INSTRUCTIONS:  Please schedule a follow-up appointment with hematology to monitor your iron  and B12 levels. We will also arrange for a CT scan to evaluate for potential gastrointestinal bleeding sources. If you have any new symptoms or concerns, please contact us  immediately.  B12 is mainly in meat, so increase meat can help this but often people have a deficiency that they are just not absorbing it well with meat or pills, so get the sublingual/melt in your mouth one.   If the sublingual one dose not increase your level, we will discuss shots.   Vitamin B12 Deficiency Vitamin B12 deficiency occurs when the body does not have enough vitamin B12, which is an important vitamin. The body needs this vitamin: To make red blood cells. To make DNA. This is the genetic material inside cells. To help the nerves work properly so they can carry messages from the brain to the body. Vitamin B12 deficiency can cause various health problems, such as a low red blood cell count (anemia) or nerve damage. What are the causes? This condition may be caused by: Not eating enough foods that contain vitamin B12. Not having enough stomach acid and digestive fluids to properly absorb vitamin B12 from the food that you eat. Certain digestive system diseases that make it hard to absorb vitamin B12. These diseases include Crohn's disease, chronic pancreatitis, and cystic fibrosis. A condition in which the body does not make enough of a protein (intrinsic factor), resulting in too few  red blood cells (pernicious anemia). Having a surgery in which part of the stomach or small intestine is removed. Taking certain medicines that make it hard for the body to absorb vitamin B12. These medicines include: Heartburn medicines (antacids and proton pump inhibitors). Certain antibiotic medicines. Some medicines  that are used to treat diabetes, tuberculosis, gout, or high cholesterol. What increases the risk? The following factors may make you more likely to develop a B12 deficiency: Being older than age 42. Eating a vegetarian or vegan diet, especially while you are pregnant. Eating a poor diet while you are pregnant. Taking certain medicines. Having alcoholism. What are the signs or symptoms? In some cases, there are no symptoms of this condition. If the condition leads to anemia or nerve damage, various symptoms can occur, such as: Weakness. Fatigue. Loss of appetite. Weight loss. Numbness or tingling in your hands and feet. Redness and burning of the tongue. Confusion or memory problems. Depression. Sensory problems, such as color blindness, ringing in the ears, or loss of taste. Diarrhea or constipation. Trouble walking. If anemia is severe, symptoms can include: Shortness of breath. Dizziness. Rapid heart rate (tachycardia). How is this diagnosed? This condition may be diagnosed with a blood test to measure the level of vitamin B12 in your blood. You may also have other tests, including: A group of tests that measure certain characteristics of blood cells (complete blood count, CBC). A blood test to measure intrinsic factor. A procedure where a thin tube with a camera on the end is used to look into your stomach or intestines (endoscopy). Other tests may be needed to discover the cause of B12 deficiency. How is this treated? Treatment for this condition depends on the cause. This condition may be treated by: Changing your eating and drinking habits, such as: Eating more foods that contain vitamin B12. Drinking less alcohol  or no alcohol . Getting vitamin B12 injections. Taking vitamin B12 supplements. Your health care provider will tell you which dosage is best for you. Follow these instructions at home: Eating and drinking  Eat lots of healthy foods that contain vitamin B12,  including: Meats and poultry. This includes beef, pork, chicken, [place], and organ meats, such as liver. Seafood. This includes clams, rainbow trout, salmon, tuna, and haddock. Eggs. Cereal and dairy products that are fortified. This means that vitamin B12 has been added to the food. Check the label on the package to see if the food is fortified. The items listed above may not be a complete list of recommended foods and beverages. Contact a dietitian for more information. General instructions Get any injections that are prescribed by your health care provider. Take supplements only as told by your health care provider. Follow the directions carefully. Do not drink alcohol  if your health care provider tells you not to. In some cases, you may only be asked to limit alcohol  use. Keep all follow-up visits as told by your health care provider. This is important. Contact a health care provider if: Your symptoms come back. Get help right away if you: Develop shortness of breath. Have a rapid heart rate. Have chest pain. Become dizzy or lose consciousness. Summary Vitamin B12 deficiency occurs when the body does not have enough vitamin B12. The main causes of vitamin B12 deficiency include dietary deficiency, digestive diseases, pernicious anemia, and having a surgery in which part of the stomach or small intestine is removed. In some cases, there are no symptoms of this condition. If the condition leads to anemia or  nerve damage, various symptoms can occur, such as weakness, shortness of breath, and numbness. Treatment may include getting vitamin B12 injections or taking vitamin B12 supplements. Eat lots of healthy foods that contain vitamin B12. This information is not intended to replace advice given to you by your health care provider. Make sure you discuss any questions you have with your health care provider. Document Revised: 06/06/2018 Document Reviewed: 08/27/2017 Elsevier Patient Education   2020 [employer].  Your CT Scan is at [facility] on 05/23/2023 at 8:30 am    Due to recent changes in healthcare laws, you may see the results of your imaging and laboratory studies on MyChart before your provider has had a chance to review them.  We understand that in some cases there may be results that are confusing or concerning to you. Not all laboratory results come back in the same time frame and the provider may be waiting for multiple results in order to interpret others.  Please give us  48 hours in order for your provider to thoroughly review all the results before contacting the office for clarification of your results.   I appreciate the  opportunity to care for you  Thank You   Natividad Balding-

## 2023-05-10 NOTE — Addendum Note (Signed)
 Addended by: Santina Cull on: 05/10/2023 08:12 AM   Modules accepted: Orders

## 2023-05-12 ENCOUNTER — Other Ambulatory Visit: Payer: Self-pay | Admitting: Medical

## 2023-05-13 LAB — TISSUE TRANSGLUTAMINASE, IGA: (tTG) Ab, IgA: <1 U/mL

## 2023-05-13 LAB — IGA: Immunoglobulin A: 228 mg/dL (ref 70–320)

## 2023-05-13 LAB — RETICULOCYTES

## 2023-05-23 ENCOUNTER — Other Ambulatory Visit (HOSPITAL_COMMUNITY)

## 2023-05-23 ENCOUNTER — Ambulatory Visit (HOSPITAL_BASED_OUTPATIENT_CLINIC_OR_DEPARTMENT_OTHER)
Admission: RE | Admit: 2023-05-23 | Discharge: 2023-05-23 | Disposition: A | Discharge status: Home / Self Care | Source: Ambulatory Visit | Attending: Physician Assistant | Admitting: Physician Assistant

## 2023-05-23 DIAGNOSIS — R1084 Generalized abdominal pain: Secondary | ICD-10-CM | POA: Diagnosis not present

## 2023-05-23 MED ORDER — IOHEXOL 300 MG/ML  SOLN
100.0000 mL | Freq: Once | INTRAMUSCULAR | Status: AC | PRN
  Administered 2023-05-23: 100 mL via INTRAVENOUS

## 2023-05-24 ENCOUNTER — Ambulatory Visit: Payer: Self-pay | Admitting: Physician Assistant

## 2023-05-25 ENCOUNTER — Other Ambulatory Visit: Payer: Self-pay | Admitting: Medical

## 2023-05-25 NOTE — Progress Notes (Signed)
 Agree with assessment/plan. CT Abdo/pelvis without any acute abnormalities  Magnus Schuller, MD [facility] [phone]

## 2023-05-29 ENCOUNTER — Other Ambulatory Visit: Payer: Self-pay | Admitting: Medical

## 2023-05-30 ENCOUNTER — Ambulatory Visit

## 2023-05-30 VITALS — BP 120/76 | Ht 59.0 in | Wt 107.0 lb

## 2023-05-30 DIAGNOSIS — Z0000 Encounter for general adult medical examination without abnormal findings: Secondary | ICD-10-CM | POA: Diagnosis not present

## 2023-05-30 DIAGNOSIS — Z2821 Immunization not carried out because of patient refusal: Secondary | ICD-10-CM

## 2023-05-30 NOTE — Patient Instructions (Signed)
 Sandy Summers , Thank you for taking time out of your busy schedule to complete your Annual Wellness Visit with me. I enjoyed our conversation and look forward to speaking with you again next year. I, as well as your care team,  appreciate your ongoing commitment to your health goals. Please review the following plan we discussed and let me know if I can assist you in the future. Your Game plan/ To Do List    Referrals: If you haven't heard from the office you've been referred to, please reach out to them at the phone provided.   Follow up Visits: Next Medicare AWV with our clinical staff: 06/02/2024   Have you seen your provider in the last 6 months (3 months if uncontrolled diabetes)? Yes Next Office Visit with your provider: n/a  Clinician Recommendations:  Aim for 30 minutes of exercise or brisk walking, 6-8 glasses of water, and 5 servings of fruits and vegetables each day.       This is a list of the screening recommended for you and due dates:  Health Maintenance  Topic Date Due   Zoster (Shingles) Vaccine (1 of 2) Never done   COVID-19 Vaccine (3 - 2024-25 season) 09/02/2022   Flu Shot  08/02/2023   Medicare Annual Wellness Visit  05/29/2024   DTaP/Tdap/Td vaccine (2 - Td or Tdap) 07/16/2030   Pneumonia Vaccine  Completed   DEXA scan (bone density measurement)  Completed   HPV Vaccine  Aged Out   Meningitis B Vaccine  Aged Out    Advanced directives: (Copy Requested) Please bring a copy of your health care power of attorney and living will to the office to be added to your chart at your convenience. You can mail to [facility] [address] [place], [place] [address] or email to [email or web address] Advance Care Planning is important because it:  [x]  Makes sure you receive the medical care that is consistent with your values, goals, and preferences  [x]  It provides guidance to your family and loved ones and reduces their decisional burden about whether  or not they are making the right decisions based on your wishes.  Follow the link provided in your after visit summary or read over the paperwork we have mailed to you to help you started getting your Advance Directives in place. If you need assistance in completing these, please reach out to us  so that we can help you!  See attachments for Preventive Care and Fall Prevention Tips.

## 2023-05-30 NOTE — Progress Notes (Signed)
 Because this visit was a virtual/telehealth visit,  certain criteria was not obtained, such a blood pressure, CBG if applicable, and timed get up and go. Any medications not marked as "taking" were not mentioned during the medication reconciliation part of the visit. Any vitals not documented were not able to be obtained due to this being a telehealth visit or patient was unable to self-report a recent blood pressure reading due to a lack of equipment at home via telehealth. Vitals that have been documented are verbally provided by the patient.   This visit was performed by a medical professional under my direct supervision. I was immediately available for consultation/collaboration. I have reviewed and agree with the Annual Wellness Visit documentation.  Subjective:   Sandy Summers is a 83 y.o. who presents for a Medicare Wellness preventive visit.  As a reminder, Annual Wellness Visits don't include a physical exam, and some assessments may be limited, especially if this visit is performed virtually. We may recommend an in-person follow-up visit with your provider if needed.  Visit Complete: Virtual I connected with  Sandy Summers on 05/30/23 by a audio enabled telemedicine application and verified that I am speaking with the correct person using two identifiers.  Patient Location: Home  Provider Location: Home Office  I discussed the limitations of evaluation and management by telemedicine. The patient expressed understanding and agreed to proceed.  Vital Signs: Because this visit was a virtual/telehealth visit, some criteria may be missing or patient reported. Any vitals not documented were not able to be obtained and vitals that have been documented are patient reported.  VideoDeclined- This patient declined Librarian, academic. Therefore the visit was completed with audio only.  Persons Participating in Visit: Patient.  AWV Questionnaire: No: Patient  Medicare AWV questionnaire was not completed prior to this visit.  Cardiac Risk Factors include: advanced age (>2men, >97 women);hypertension;dyslipidemia     Objective:     Today's Vitals   05/30/23 0943  BP: 120/76  Weight: 107 lb (48.5 kg)  Height: 4\' 11"  (1.499 m)   Body mass index is 21.61 kg/m.     05/30/2023    9:41 AM 04/30/2022    9:01 AM 09/01/2021   10:48 AM 08/27/2021    7:34 AM 04/20/2021    8:56 AM 11/15/2020   11:44 PM 04/29/2020    4:36 PM  Advanced Directives  Does Patient Have a Medical Advance Directive? Yes Yes Yes Yes Yes Yes No  Type of Estate agent of [place];Living will Healthcare Power of [place];Living will Healthcare Power of [employer] Power of [employer] Power of [place];Living will Healthcare Power of [place];Living will   Does patient want to make changes to medical advance directive? No - Patient declined     No - Patient declined   Copy of Healthcare Power of Attorney in Chart? No - copy requested No - copy requested  No - copy requested No - copy requested    Would patient like information on creating a medical advance directive?       No - Patient declined    Current Medications (verified) Outpatient Encounter Medications as of 05/30/2023  Medication Sig   aspirin 81 MG chewable tablet Chew 81 mg by mouth daily.     atorvastatin  (LIPITOR) 20 MG tablet TAKE 1 TABLET BY MOUTH ONCE DAILY . APPOINTMENT REQUIRED FOR FUTURE REFILLS   Cholecalciferol  25 MCG (1000 UT) tablet Take 1,000 Units by mouth daily.   cyclobenzaprine  (  FLEXERIL ) 5 MG tablet Take 1 tablet (5 mg total) by mouth at bedtime.   levothyroxine  (SYNTHROID ) 50 MCG tablet Take 1 tablet (50 mcg total) by mouth daily before breakfast.   losartan  (COZAAR ) 50 MG tablet Take 1 tablet (50 mg total) by mouth daily.   metoprolol  succinate (TOPROL -XL) 50 MG 24 hr tablet TAKE 1 TABLET BY MOUTH AT BEDTIME   Multiple Vitamins-Minerals (PRESERVISION AREDS 2  PO) Take 1 capsule by mouth daily.   SV IRON  325 (65 Fe) MG tablet Take 1 tablet by mouth once daily   traZODone  (DESYREL ) 50 MG tablet TAKE 1/2 TO 1 (ONE-HALF TO ONE) TABLET BY MOUTH AT BEDTIME   acetaminophen  (TYLENOL  8 HOUR) 650 MG CR tablet 1 tab po q 8 hours prn pain (Patient not taking: Reported on 05/30/2023)   acetaminophen -codeine  (TYLENOL  #3) 300-30 MG tablet Take 1 tablet by mouth every 8 (eight) hours as needed for moderate pain (pain score 4-6). (Patient not taking: Reported on 05/30/2023)   celecoxib  (CELEBREX ) 100 MG capsule Take 1 capsule (100 mg total) by mouth 2 (two) times daily. (Patient not taking: Reported on 04/01/2023)   lidocaine  (LIDODERM ) 5 % Place 1 patch onto the skin daily. Remove & Discard patch within 12 hours or as directed by MD (Patient not taking: Reported on 05/30/2023)   methylPREDNISolone  (MEDROL ) 4 MG tablet Standard 6 day taper dose (Patient not taking: Reported on 04/01/2023)   senna-docusate (SENOKOT-S) 8.6-50 MG tablet Take 1 tablet by mouth at bedtime as needed for mild constipation. (Patient not taking: Reported on 05/30/2023)   No facility-administered encounter medications on file as of 05/30/2023.    Allergies (verified) Alendronate, Amoxicillin-pot clavulanate, Denosumab, Gabapentin, Hydrocodone -acetaminophen , and Tramadol   History: Past Medical History:  Diagnosis Date   Cholelithiasis    Hyperlipidemia    Hypertension    Thyroid  disease    Past Surgical History:  Procedure Laterality Date   CORONARY ANGIOPLASTY WITH STENT PLACEMENT  in 2002   HIP PINNING,CANNULATED Left 11/16/2020   Procedure: CANNULATED HIP PINNING;  Surgeon: Bettyjane Brunet, MD;  Location: [facility];  Service: Orthopedics;  Laterality: Left;  synthes, cannulated screws, hanna table, large c-arm,    can go as early as 2 p.m.   IR VERTEBROPLASTY LUMBAR BX INC UNI/BIL INC/INJECT/IMAGING  04/19/2023   Family History  Problem Relation Age of Onset   Liver disease Neg Hx     Esophageal cancer Neg Hx    Colon cancer Neg Hx    Social History   Socioeconomic History   Marital status: Married    Spouse name: Not on file   Number of children: 4   Years of education: Not on file   Highest education level: Not on file  Occupational History   Occupation: retired  Tobacco Use   Smoking status: Former    Current packs/day: 0.00    Types: Cigarettes    Quit date: 10/06/1995    Years since quitting: 27.6   Smokeless tobacco: Never  Vaping Use   Vaping status: Former  Substance and Sexual Activity   Alcohol  use: No   Drug use: No   Sexual activity: Not Currently  Other Topics Concern   Not on file  Social History Narrative   Not on file   Social Drivers of Health   Financial Resource Strain: Low Risk  (04/20/2021)   Overall Financial Resource Strain (CARDIA)    Difficulty of Paying Living Expenses: Not hard at all  Food Insecurity: No Food Insecurity (  05/30/2023)   Hunger Vital Sign    Worried About Running Out of Food in the Last Year: Never true    Ran Out of Food in the Last Year: Never true  Transportation Needs: No Transportation Needs (05/30/2023)   PRAPARE - Administrator, Civil Service (Medical): No    Lack of Transportation (Non-Medical): No  Physical Activity: Sufficiently Active (05/30/2023)   Exercise Vital Sign    Days of Exercise per Week: 7 days    Minutes of Exercise per Session: 90 min  Stress: No Stress Concern Present (05/30/2023)   [employer] of Occupational Health - Occupational Stress Questionnaire    Feeling of Stress : Not at all  Social Connections: Moderately Integrated (05/30/2023)   Social Connection and Isolation Panel [NHANES]    Frequency of Communication with Friends and Family: Twice a week    Frequency of Social Gatherings with Friends and Family: Twice a week    Attends Religious Services: 1 to 4 times per year    Active Member of [employer] or Organizations: No    Attends Hospital doctor: Never    Marital Status: Married    Tobacco Counseling Counseling given: Not Answered    Clinical Intake:  Pre-visit preparation completed: Yes  Pain : No/denies pain     BMI - recorded: 21.61 Nutritional Status: BMI of 19-24  Normal Nutritional Risks: None Diabetes: No  Lab Results  Component Value Date   HGBA1C 5.7 09/17/2022     How often do you need to have someone help you when you read instructions, pamphlets, or other written materials from your doctor or pharmacy?: 1 - Never  Interpreter Needed?: No  Information entered by :: Juliann Ochoa   Activities of Daily Living     05/30/2023    9:47 AM  In your present state of health, do you have any difficulty performing the following activities:  Hearing? 0  Vision? 0  Difficulty concentrating or making decisions? 0  Walking or climbing stairs? 0  Dressing or bathing? 0  Doing errands, shopping? 0  Preparing Food and eating ? N  Using the Toilet? N  In the past six months, have you accidently leaked urine? N  Do you have problems with loss of bowel control? N  Managing your Medications? N  Managing your Finances? N  Housekeeping or managing your Housekeeping? N    Patient Care Team: Saguier, Edward, PA-C as PCP - General (Internal Medicine) Clark-Burning, Bridgette Campus, PA-C (Inactive) (Dermatology)  Indicate any recent Medical Services you may have received from other than [facility] providers in the past year (date may be approximate).     Assessment:    This is a routine wellness examination for [facility].  Hearing/Vision screen Hearing Screening - Comments:: No hearing difficulties  Vision Screening - Comments:: Wears glasses    Goals Addressed             This Visit's Progress    Exercise 3x per week (30 min per time)   On track    Continue to exercise and stay healthy.        Depression Screen     05/30/2023    9:49 AM 04/30/2022    9:06 AM 08/08/2021    8:00 AM 04/20/2021     8:51 AM 01/23/2021   10:11 AM  PHQ 2/9 Scores  PHQ - 2 Score 0 0 0 0 0  PHQ- 9 Score 0  Fall Risk     05/30/2023    9:46 AM 04/30/2022    9:03 AM 02/12/2022    8:03 AM 08/08/2021    8:00 AM 04/20/2021    8:57 AM  Fall Risk   Falls in the past year? 0 0 0 1 1  Number falls in past yr: 0 0 0 0 0  Injury with Fall? 0 0 0 1 1  Risk for fall due to : No Fall Risks No Fall Risks No Fall Risks History of fall(s);Impaired balance/gait History of fall(s);Impaired balance/gait  Follow up Falls evaluation completed Falls evaluation completed Falls evaluation completed;Education provided Falls evaluation completed Falls prevention discussed    MEDICARE RISK AT HOME:  Medicare Risk at Home Any stairs in or around the home?: Yes If so, are there any without handrails?: No Home free of loose throw rugs in walkways, pet beds, electrical cords, etc?: Yes Adequate lighting in your home to reduce risk of falls?: Yes Life alert?: No Use of a cane, walker or w/c?: No Grab bars in the bathroom?: Yes Shower chair or bench in shower?: No Elevated toilet seat or a handicapped toilet?: No  TIMED UP AND GO:  Was the test performed?  No  Cognitive Function: 6CIT completed        05/30/2023    9:46 AM 04/30/2022    9:12 AM 04/20/2021    9:00 AM  6CIT Screen  What Year? 0 points 0 points 0 points  What month? 0 points 0 points 0 points  What time? 0 points 0 points 0 points  Count back from 20 0 points 0 points 0 points  Months in reverse 0 points 0 points 0 points  Repeat phrase 0 points 0 points 0 points  Total Score 0 points 0 points 0 points    Immunizations Immunization History  Administered Date(s) Administered   Influenza, High Dose Seasonal PF 10/09/2014, 09/29/2018   Influenza-Unspecified 09/28/2020, 10/20/2021   PFIZER(Purple Top)SARS-COV-2 Vaccination 02/06/2019, 03/03/2019   Pneumococcal Conjugate-13 04/09/2013   Pneumococcal Polysaccharide-23 01/23/2006   Tdap 07/15/2020     Screening Tests Health Maintenance  Topic Date Due   Zoster Vaccines- Shingrix (1 of 2) Never done   COVID-19 Vaccine (3 - 2024-25 season) 09/02/2022   INFLUENZA VACCINE  08/02/2023   Medicare Annual Wellness (AWV)  05/29/2024   DTaP/Tdap/Td (2 - Td or Tdap) 07/16/2030   Pneumonia Vaccine 60+ Years old  Completed   DEXA SCAN  Completed   HPV VACCINES  Aged Out   Meningococcal B Vaccine  Aged Out    Health Maintenance  Health Maintenance Due  Topic Date Due   Zoster Vaccines- Shingrix (1 of 2) Never done   COVID-19 Vaccine (3 - 2024-25 season) 09/02/2022   Health Maintenance Items Addressed:  Additional Screening:  Vision Screening: Recommended annual ophthalmology exams for early detection of glaucoma and other disorders of the eye.  Dental Screening: Recommended annual dental exams for proper oral hygiene  Community Resource Referral / Chronic Care Management: CRR required this visit?  No   CCM required this visit?  No   Plan:    I have personally reviewed and noted the following in the patient's chart:   Medical and social history Use of alcohol , tobacco or illicit drugs  Current medications and supplements including opioid prescriptions. Patient is not currently taking opioid prescriptions. Functional ability and status Nutritional status Physical activity Advanced directives List of other physicians Hospitalizations, surgeries, and ER visits in previous 12 months  Vitals Screenings to include cognitive, depression, and falls Referrals and appointments  In addition, I have reviewed and discussed with patient certain preventive protocols, quality metrics, and best practice recommendations. A written personalized care plan for preventive services as well as general preventive health recommendations were provided to patient.   Freeda Jerry, [place]   05/30/2023   After Visit Summary: (MyChart) Due to this being a telephonic visit, the after visit  summary with patients personalized plan was offered to patient via MyChart   Notes: Nothing significant to report at this time.

## 2023-06-04 ENCOUNTER — Other Ambulatory Visit: Payer: Self-pay | Admitting: Medical

## 2023-06-09 ENCOUNTER — Other Ambulatory Visit: Payer: Self-pay | Admitting: Medical

## 2023-06-12 ENCOUNTER — Other Ambulatory Visit: Payer: Self-pay | Admitting: Medical

## 2023-06-23 ENCOUNTER — Other Ambulatory Visit: Payer: Self-pay | Admitting: Medical

## 2023-06-30 ENCOUNTER — Other Ambulatory Visit: Payer: Self-pay | Admitting: Medical

## 2023-07-03 DIAGNOSIS — H353132 Nonexudative age-related macular degeneration, bilateral, intermediate dry stage: Secondary | ICD-10-CM | POA: Diagnosis not present

## 2023-07-03 DIAGNOSIS — Z961 Presence of intraocular lens: Secondary | ICD-10-CM | POA: Diagnosis not present

## 2023-07-03 DIAGNOSIS — H40053 Ocular hypertension, bilateral: Secondary | ICD-10-CM | POA: Diagnosis not present

## 2023-07-07 ENCOUNTER — Other Ambulatory Visit: Payer: Self-pay | Admitting: Medical

## 2023-07-08 NOTE — Progress Notes (Unsigned)
 07/09/2023 Sandy Summers [id number] 1940-10-11  Referring provider: Dorina Loving, PA-C Primary GI doctor: Dr. Charlanne  ASSESSMENT AND PLAN:  IDA with positive FOBT 05/06/2023 05/09/2023  HGB 11.4 MCV 87.2 Platelets 396.0 (05/01/2023  HGB 10.6 MCV 87.1 Platelets 361.0) 05/09/2023 Iron  140 Ferritin 23.4 B12 181 (05/01/2023 Iron  35 Ferritin 25 B12 191) Recent Labs    03/15/23 1143 03/25/23 1132 05/01/23 0838 05/09/23 1159  HGB 10.8* 11.0* 10.6* 11.4*  Remote colonoscopy 2008  She has been on the iron  once a day with B12, she is tolerating it well other than dark stools No overt GI bleeding, no family history of colon cancer, no upper GI symptoms CT abdomen pelvis unremarkable 2025 -Due to age and comorbid conditions patient had declined EGD colonoscopy previously at office visit 05/09/2023, referred to hematology for supportive care but she did not want to go, candid discussion today and she is feeling well, normal CT 05/2023, she understands risk of missed cancer but declines endoscopic evaluation - will recheck iron , ferritin, and B12 today - Continue oral iron  supplementation. 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 female with a past medical history listed  below presents as a new patient for evaluation of anemia and positive FOBT.   Patient was last seen in the office 05/09/2023 by myself for IDA.  Patient had significantly low B12 at 181 referred to hematology but she did not go, since she did not want to go to doctors that month. Improved hemoglobin with oral iron  ferritin 23 iron  140.  Negative celiac CT abdomen pelvis unremarkable other than small hiatal hernia. Candid discussion with patient and family members, declined EGD/colonoscopy for IDA and positive FOBT.  Discussed the use of AI scribe software for clinical note transcription with the patient, who gave verbal consent to proceed.  History of Present Illness   Sandy Summers is an 83 year old female who presents for follow-up on her B12 and iron  levels. She was referred by her primary care physician for evaluation of her low B12 and iron  levels.  She has been taking oral B12 and iron  supplements since her last visit in May. She takes the B12 in pill form and the iron  once daily. She has not experienced any constipation or stomach upset from the iron , but her bowel movements have become darker.  Her hemoglobin levels have improved, rising from 10.6 to 11.4 since her last visit. No shortness of breath, chest pain, abdominal pain, nausea, or vomiting. She reports regular bowel movements, aided by Metamucil, which she takes after her morning medications.      She  reports that she quit smoking about 27 years ago. Her smoking use included cigarettes. She has never used smokeless tobacco. She reports that she does not  drink alcohol  and does not use drugs.  RELEVANT GI HISTORY, IMAGING AND LABS: Results   LABS Hb: 10.6 (05/01/2023) Hb: 11.4 (05/09/2023)  RADIOLOGY CT abdomen and pelvis: Normal      CBC    Component Value Date/Time   WBC 7.7 05/09/2023 1159   RBC 4.07 05/09/2023 1159   HGB 11.4 (L) 05/09/2023 1159   HCT 35.5 (L) 05/09/2023 1159   PLT 396.0 05/09/2023 1159   MCV  87.2 05/09/2023 1159   MCH 27.1 03/15/2023 1143   MCHC 32.2 05/09/2023 1159   RDW 18.9 (H) 05/09/2023 1159   LYMPHSABS 1.4 05/09/2023 1159   MONOABS 0.7 05/09/2023 1159   EOSABS 0.1 05/09/2023 1159   BASOSABS 0.0 05/09/2023 1159   Recent Labs    03/15/23 1143 03/25/23 1132 05/01/23 0838 05/09/23 1159  HGB 10.8* 11.0* 10.6* 11.4*    CMP     Component Value Date/Time   NA 139 05/09/2023 1159   K 4.1 05/09/2023 1159   CL 102 05/09/2023 1159   CO2 26 05/09/2023 1159   GLUCOSE 142 (H) 05/09/2023 1159   BUN 18 05/09/2023 1159   CREATININE 0.95 05/09/2023 1159   CALCIUM  9.3 05/09/2023 1159   PROT 7.4 05/09/2023 1159   ALBUMIN 4.0 05/09/2023 1159   AST 19 05/09/2023 1159   ALT 15 05/09/2023 1159   ALKPHOS 105 05/09/2023 1159   BILITOT 0.3 05/09/2023 1159   GFRNONAA >60 03/15/2023 1143      Latest Ref Rng & Units 05/09/2023   11:59 AM 03/25/2023   11:32 AM 03/15/2023   11:43 AM  Hepatic Function  Total Protein 6.0 - 8.3 g/dL 7.4  6.8  7.0   Albumin 3.5 - 5.2 g/dL 4.0  3.9  3.1   AST 0 - 37 U/L 19  16  18    ALT 0 - 35 U/L 15  13  16    Alk Phosphatase 39 - 117 U/L 105  125  124   Total Bilirubin 0.2 - 1.2 mg/dL 0.3  0.4  1.0       Current Medications:   Current Outpatient Medications (Endocrine & Metabolic):    levothyroxine  (SYNTHROID ) 50 MCG tablet, Take 1 tablet (50 mcg total) by mouth daily before breakfast.   methylPREDNISolone  (MEDROL ) 4 MG tablet, Standard 6 day taper dose (Patient not taking: Reported on 07/09/2023)  Current Outpatient Medications (Cardiovascular):    atorvastatin  (LIPITOR) 20 MG tablet, TAKE 1 TABLET BY MOUTH ONCE DAILY . APPOINTMENT REQUIRED FOR FUTURE REFILLS   losartan  (COZAAR ) 50 MG tablet, Take 1 tablet (50 mg total) by mouth daily.   metoprolol  succinate (TOPROL -XL) 50 MG 24 hr tablet, Take 1 tablet (50 mg total) by mouth at bedtime.   Current Outpatient Medications (Analgesics):    acetaminophen  (TYLENOL  8 HOUR) 650 MG CR tablet, 1 tab  po q 8 hours prn pain (Patient not taking: Reported on 05/30/2023)   acetaminophen -codeine  (TYLENOL  #3) 300-30 MG tablet, Take 1 tablet by mouth every 8 (eight) hours as needed for moderate pain (pain score 4-6). (Patient not taking: Reported on 05/30/2023)   aspirin 81 MG chewable tablet, Chew 81 mg by mouth daily.     celecoxib  (CELEBREX ) 100 MG capsule, Take 1 capsule (100 mg total) by mouth 2 (two) times daily. (Patient not taking: Reported on 04/01/2023)  Current Outpatient Medications (Hematological):    FEROSUL 325 (65 Fe) MG tablet, Take 1 tablet by mouth once daily  Current Outpatient Medications (Other):    Cholecalciferol  25 MCG (  1000 UT) tablet, Take 1,000 Units by mouth daily. (Patient not taking: Reported on 07/09/2023)   cyclobenzaprine  (FLEXERIL ) 5 MG tablet, Take 1 tablet (5 mg total) by mouth at bedtime. (Patient not taking: Reported on 07/09/2023)   lidocaine  (LIDODERM ) 5 %, Place 1 patch onto the skin daily. Remove & Discard patch within 12 hours or as directed by MD (Patient not taking: Reported on 05/30/2023)   Multiple Vitamins-Minerals (PRESERVISION AREDS 2 PO), Take 1 capsule by mouth daily.   senna-docusate (SENOKOT-S) 8.6-50 MG tablet, Take 1 tablet by mouth at bedtime as needed for mild constipation. (Patient not taking: Reported on 05/30/2023)   traZODone  (DESYREL ) 50 MG tablet, TAKE 1/2 TO 1 (ONE-HALF TO ONE) TABLET BY MOUTH EVERY DAY AT BEDTIME (Patient not taking: Reported on 07/09/2023)  Medical History:  Past Medical History:  Diagnosis Date   Cholelithiasis    Hyperlipidemia    Hypertension    Thyroid  disease    Allergies:  Allergies  Allergen Reactions   Alendronate Nausea Only   Amoxicillin-Pot Clavulanate Diarrhea   Denosumab Other (See Comments)    I just didn't like it    Gabapentin Other (See Comments)    Ineffective for sleep made me crazy   Hydrocodone -Acetaminophen  Other (See Comments)   Tramadol Other (See Comments)    Delirium      Surgical  History:  She  has a past surgical history that includes Coronary angioplasty with stent (in 2002); Hip pinning, cannulated (Left, 11/16/2020); and IR VERTEBROPLASTY LUMBAR BX INC UNI/BIL INC INJECT/IMAGING (04/19/2023). Family History:  Her family history is not on file.  REVIEW OF SYSTEMS  : All other systems reviewed and negative except where noted in the History of Present Illness.  PHYSICAL EXAM: BP 128/60   Pulse 70   Ht 4' 11 (1.499 m)   Wt 110 lb 3.2 oz (50 kg)   BMI 22.26 kg/m  Physical Exam   GENERAL APPEARANCE: elderly appearing, in no apparent distress. HEENT: No cervical lymphadenopathy, unremarkable thyroid , sclerae anicteric, conjunctiva pink. RESPIRATORY: Respiratory effort normal, breath sounds equal bilaterally without rales, rhonchi, or wheezing. CARDIO: Systolic murmur with regular rate and rhythm, peripheral pulses intact. ABDOMEN: Soft, non-distended, active bowel sounds in all four quadrants, non-tender to palpation, no rebound, no mass appreciated. RECTAL: Declines. MUSCULOSKELETAL: Full range of motion, antalgic gait, slightly unstable able to get on table with assistance without edema. SKIN: Dry, intact without rashes or lesions. No jaundice. NEURO: Alert, oriented, no focal deficits. PSYCH: Cooperative, normal mood and affect.      Alan JONELLE Coombs, PA-C 11:45 AM

## 2023-07-09 ENCOUNTER — Ambulatory Visit: Admitting: Physician Assistant

## 2023-07-09 ENCOUNTER — Encounter: Payer: Self-pay | Admitting: Physician Assistant

## 2023-07-09 VITALS — BP 128/60 | HR 70 | Ht 59.0 in | Wt 110.2 lb

## 2023-07-09 DIAGNOSIS — D509 Iron deficiency anemia, unspecified: Secondary | ICD-10-CM | POA: Diagnosis not present

## 2023-07-09 DIAGNOSIS — Z87891 Personal history of nicotine dependence: Secondary | ICD-10-CM

## 2023-07-09 DIAGNOSIS — I2510 Atherosclerotic heart disease of native coronary artery without angina pectoris: Secondary | ICD-10-CM

## 2023-07-09 DIAGNOSIS — I350 Nonrheumatic aortic (valve) stenosis: Secondary | ICD-10-CM

## 2023-07-09 DIAGNOSIS — E538 Deficiency of other specified B group vitamins: Secondary | ICD-10-CM | POA: Diagnosis not present

## 2023-07-09 DIAGNOSIS — Z955 Presence of coronary angioplasty implant and graft: Secondary | ICD-10-CM

## 2023-07-09 DIAGNOSIS — R195 Other fecal abnormalities: Secondary | ICD-10-CM | POA: Diagnosis not present

## 2023-07-09 NOTE — Patient Instructions (Addendum)
 Get your labs done at your primary care office Please continue the iron  and the B12  VISIT SUMMARY:  Today, you came in for a follow-up on your B12 and iron  levels. Since your last visit in May, you have been taking oral B12 and iron  supplements. Your hemoglobin levels have improved, and you have not experienced any significant side effects from the supplements.  YOUR PLAN:  -IRON  DEFICIENCY ANEMIA: Iron  deficiency anemia is a condition where your body lacks enough iron  to produce adequate levels of hemoglobin, which is necessary for carrying oxygen in your blood. Your hemoglobin levels have improved from 10.6 to 11.4 with the oral iron  supplements. You should continue taking the iron  supplement once daily and monitor for any signs of gastrointestinal bleeding or changes in your anemia status. We will recheck your iron  levels through a blood test.  -VITAMIN B12 DEFICIENCY: Vitamin B12 deficiency occurs when your body does not have enough B12, which is essential for nerve function and the production of red blood cells. You are currently taking oral B12 supplements, and we will recheck your B12 levels through a blood test. If your levels remain low, we will discuss alternative supplementation options.  INSTRUCTIONS:  Please schedule a blood test to recheck your iron  and Vitamin B12 levels. Continue taking your current supplements as directed. Monitor for any signs of gastrointestinal bleeding or changes in your anemia status. If you have any concerns or experience any new symptoms, please contact our office.  325 mg iron  once a day, with orange juice or vitamin C to absorb better.  Increase food rich in iron .  Can cause constipation, can add on miralax or the fiber.  If you can not tolerate it, let us  know and we can get you an IV iron .   B12 is mainly in meat, so increase meat can help this but often people have a deficiency that they are just not absorbing it well with meat or pills, so get the  sublingual/melt in your mouth one.   If the sublingual one dose not increase your level, we will discuss shots.   Vitamin B12 Deficiency Vitamin B12 deficiency occurs when the body does not have enough vitamin B12, which is an important vitamin. The body needs this vitamin: To make red blood cells. To make DNA. This is the genetic material inside cells. To help the nerves work properly so they can carry messages from the brain to the body. Vitamin B12 deficiency can cause various health problems, such as a low red blood cell count (anemia) or nerve damage. What are the causes? This condition may be caused by: Not eating enough foods that contain vitamin B12. Not having enough stomach acid and digestive fluids to properly absorb vitamin B12 from the food that you eat. Certain digestive system diseases that make it hard to absorb vitamin B12. These diseases include Crohn's disease, chronic pancreatitis, and cystic fibrosis. A condition in which the body does not make enough of a protein (intrinsic factor), resulting in too few red blood cells (pernicious anemia). Having a surgery in which part of the stomach or small intestine is removed. Taking certain medicines that make it hard for the body to absorb vitamin B12. These medicines include: Heartburn medicines (antacids and proton pump inhibitors). Certain antibiotic medicines. Some medicines that are used to treat diabetes, tuberculosis, gout, or high cholesterol. What increases the risk? The following factors may make you more likely to develop a B12 deficiency: Being older than age 54. Eating  a vegetarian or vegan diet, especially while you are pregnant. Eating a poor diet while you are pregnant. Taking certain medicines. Having alcoholism. What are the signs or symptoms? In some cases, there are no symptoms of this condition. If the condition leads to anemia or nerve damage, various symptoms can occur, such  as: Weakness. Fatigue. Loss of appetite. Weight loss. Numbness or tingling in your hands and feet. Redness and burning of the tongue. Confusion or memory problems. Depression. Sensory problems, such as color blindness, ringing in the ears, or loss of taste. Diarrhea or constipation. Trouble walking. If anemia is severe, symptoms can include: Shortness of breath. Dizziness. Rapid heart rate (tachycardia). How is this diagnosed? This condition may be diagnosed with a blood test to measure the level of vitamin B12 in your blood. You may also have other tests, including: A group of tests that measure certain characteristics of blood cells (complete blood count, CBC). A blood test to measure intrinsic factor. A procedure where a thin tube with a camera on the end is used to look into your stomach or intestines (endoscopy). Other tests may be needed to discover the cause of B12 deficiency. How is this treated? Treatment for this condition depends on the cause. This condition may be treated by: Changing your eating and drinking habits, such as: Eating more foods that contain vitamin B12. Drinking less alcohol  or no alcohol . Getting vitamin B12 injections. Taking vitamin B12 supplements. Your health care provider will tell you which dosage is best for you. Follow these instructions at home: Eating and drinking  Eat lots of healthy foods that contain vitamin B12, including: Meats and poultry. This includes beef, pork, chicken, [place], and organ meats, such as liver. Seafood. This includes clams, rainbow trout, salmon, tuna, and haddock. Eggs. Cereal and dairy products that are fortified. This means that vitamin B12 has been added to the food. Check the label on the package to see if the food is fortified. The items listed above may not be a complete list of recommended foods and beverages. Contact a dietitian for more information. General instructions Get any injections that are  prescribed by your health care provider. Take supplements only as told by your health care provider. Follow the directions carefully. Do not drink alcohol  if your health care provider tells you not to. In some cases, you may only be asked to limit alcohol  use. Keep all follow-up visits as told by your health care provider. This is important. Contact a health care provider if: Your symptoms come back. Get help right away if you: Develop shortness of breath. Have a rapid heart rate. Have chest pain. Become dizzy or lose consciousness. Summary Vitamin B12 deficiency occurs when the body does not have enough vitamin B12. The main causes of vitamin B12 deficiency include dietary deficiency, digestive diseases, pernicious anemia, and having a surgery in which part of the stomach or small intestine is removed. In some cases, there are no symptoms of this condition. If the condition leads to anemia or nerve damage, various symptoms can occur, such as weakness, shortness of breath, and numbness. Treatment may include getting vitamin B12 injections or taking vitamin B12 supplements. Eat lots of healthy foods that contain vitamin B12. This information is not intended to replace advice given to you by your health care provider. Make sure you discuss any questions you have with your health care provider. Document Revised: 06/06/2018 Document Reviewed: 08/27/2017 Elsevier Patient Education  2020 [employer].

## 2023-07-11 ENCOUNTER — Ambulatory Visit: Payer: Self-pay | Admitting: Medical

## 2023-07-11 ENCOUNTER — Other Ambulatory Visit

## 2023-07-11 ENCOUNTER — Encounter: Payer: Self-pay | Admitting: Medical

## 2023-07-11 ENCOUNTER — Ambulatory Visit (INDEPENDENT_AMBULATORY_CARE_PROVIDER_SITE_OTHER): Admitting: Medical

## 2023-07-11 ENCOUNTER — Telehealth: Payer: Self-pay

## 2023-07-11 VITALS — BP 140/75 | Temp 97.7°F | Ht 59.0 in | Wt 110.0 lb

## 2023-07-11 DIAGNOSIS — I10 Essential (primary) hypertension: Secondary | ICD-10-CM

## 2023-07-11 DIAGNOSIS — E785 Hyperlipidemia, unspecified: Secondary | ICD-10-CM | POA: Diagnosis not present

## 2023-07-11 DIAGNOSIS — D649 Anemia, unspecified: Secondary | ICD-10-CM

## 2023-07-11 DIAGNOSIS — E538 Deficiency of other specified B group vitamins: Secondary | ICD-10-CM | POA: Diagnosis not present

## 2023-07-11 LAB — CBC WITH DIFFERENTIAL/PLATELET
Basophils Absolute: 0 K/uL (ref 0.0–0.1)
Basophils Relative: 0.5 % (ref 0.0–3.0)
Eosinophils Absolute: 0.3 K/uL (ref 0.0–0.7)
Eosinophils Relative: 4.7 % (ref 0.0–5.0)
HCT: 37.2 % (ref 36.0–46.0)
Hemoglobin: 12.2 g/dL (ref 12.0–15.0)
Lymphocytes Relative: 17.9 % (ref 12.0–46.0)
Lymphs Abs: 1 K/uL (ref 0.7–4.0)
MCHC: 32.8 g/dL (ref 30.0–36.0)
MCV: 92.2 fl (ref 78.0–100.0)
Monocytes Absolute: 0.5 K/uL (ref 0.1–1.0)
Monocytes Relative: 8.6 % (ref 3.0–12.0)
Neutro Abs: 4 K/uL (ref 1.4–7.7)
Neutrophils Relative %: 68.3 % (ref 43.0–77.0)
Platelets: 258 K/uL (ref 150.0–400.0)
RBC: 4.04 Mil/uL (ref 3.87–5.11)
RDW: 17.9 % — ABNORMAL HIGH (ref 11.5–15.5)
WBC: 5.8 K/uL (ref 4.0–10.5)

## 2023-07-11 LAB — COMPREHENSIVE METABOLIC PANEL WITH GFR
ALT: 23 U/L (ref 0–35)
AST: 26 U/L (ref 0–37)
Albumin: 4.2 g/dL (ref 3.5–5.2)
Alkaline Phosphatase: 89 U/L (ref 39–117)
BUN: 15 mg/dL (ref 6–23)
CO2: 28 meq/L (ref 19–32)
Calcium: 9.4 mg/dL (ref 8.4–10.5)
Chloride: 103 meq/L (ref 96–112)
Creatinine, Ser: 0.83 mg/dL (ref 0.40–1.20)
GFR: 65.63 mL/min (ref >60.00)
Glucose, Bld: 89 mg/dL (ref 70–99)
Potassium: 4 meq/L (ref 3.5–5.1)
Sodium: 139 meq/L (ref 135–145)
Total Bilirubin: 0.4 mg/dL (ref 0.2–1.2)
Total Protein: 6.7 g/dL (ref 6.0–8.3)

## 2023-07-11 LAB — LIPID PANEL
Cholesterol: 126 mg/dL (ref 0–200)
HDL: 50.3 mg/dL (ref >39.00)
LDL Cholesterol: 51 mg/dL (ref 0–99)
NonHDL: 75.43
Total CHOL/HDL Ratio: 2
Triglycerides: 122 mg/dL (ref 0.0–149.0)
VLDL: 24.4 mg/dL (ref 0.0–40.0)

## 2023-07-11 LAB — VITAMIN B12: Vitamin B-12: >1500 pg/mL — ABNORMAL HIGH (ref 211–911)

## 2023-07-11 MED ORDER — ATORVASTATIN CALCIUM 20 MG PO TABS: 20.0000 mg | ORAL_TABLET | Freq: Every day | ORAL | 3 refills | Status: AC

## 2023-07-11 NOTE — Telephone Encounter (Signed)
 Copied from CRM [id number]. Topic: General - Other >> Jul 11, 2023  1:59 PM Sandy Summers wrote: Reason for CRM: PT called to advise she is taking 1000 MCG B12

## 2023-07-11 NOTE — Patient Instructions (Signed)
 Hypertension Blood pressure controlled with losartan  and metoprolol . - Continue losartan  and metoprolol . - Advise home blood pressure monitoring once or twice a week when relaxed, ensuring it remains below 140/90 mmHg.  Anemia Improving B12 levels with supplements. B12 dosage needs confirmation. - Order CBC, B12 level, and iron  panel. - Request confirmation of B12 dosage.  Hyperlipidemia Requires statin refill. Regular monitoring necessary. - Order lipid panel. - Refill statin prescription.  General Health Maintenance Prefers direct communication for updates. - Encourage use of MyChart or direct clinic communication for updates on lab results and medication dosages.  Follow up date to be determined after lab review.

## 2023-07-11 NOTE — Progress Notes (Signed)
 Subjective:    Patient ID: Sandy Summers, female    DOB: 08-Jan-1940, 83 y.o.   MRN: [id number]  HPI The patient presents for a follow-up visit to refill her statin prescription and to discuss blood pressure management and anemia.  The patient is primarily here to refill her statin prescription. . BP  125/80 at a recent gastroenterologist visit. She is currently on losartan  and metoprolol , uses a home blood pressure cuff.. She expresses a desire to reduce her medication burden. Bp high initially but came down today on second recheck.  The patient is managing anemia with iron . B12 is low  with daily B12 supplements, though she is unsure of the dosage, which she started two months ago due to low levels. B12 levels have slightly improved.  She dislikes using MyChart for communication.   Review of Systems  Constitutional:  Negative for chills, fatigue and fever.  HENT:  Negative for congestion.   Respiratory:  Negative for chest tightness, shortness of breath and wheezing.   Cardiovascular:  Negative for chest pain and palpitations.  Gastrointestinal:  Negative for abdominal pain.  Genitourinary:  Negative for dyspareunia, flank pain and frequency.  Musculoskeletal:  Negative for back pain.  Neurological:  Negative for facial asymmetry, weakness and numbness.  Psychiatric/Behavioral:  Negative for behavioral problems and dysphoric mood.     Past Medical History:  Diagnosis Date   Cholelithiasis    Hyperlipidemia    Hypertension    Thyroid  disease      Social History   Socioeconomic History   Marital status: Married    Spouse name: Not on file   Number of children: 4   Years of education: Not on file   Highest education level: Not on file  Occupational History   Occupation: retired  Tobacco Use   Smoking status: Former    Current packs/day: 0.00    Types: Cigarettes    Quit date: 10/06/1995    Years since quitting: 27.7   Smokeless tobacco: Never  Vaping Use   Vaping  status: Former  Substance and Sexual Activity   Alcohol  use: No   Drug use: No   Sexual activity: Not Currently  Other Topics Concern   Not on file  Social History Narrative   Not on file   Social Drivers of Health   Financial Resource Strain: Low Risk  (04/20/2021)   Overall Financial Resource Strain (CARDIA)    Difficulty of Paying Living Expenses: Not hard at all  Food Insecurity: No Food Insecurity (05/30/2023)   Hunger Vital Sign    Worried About Running Out of Food in the Last Year: Never true    Ran Out of Food in the Last Year: Never true  Transportation Needs: No Transportation Needs (05/30/2023)   PRAPARE - Administrator, Civil Service (Medical): No    Lack of Transportation (Non-Medical): No  Physical Activity: Sufficiently Active (05/30/2023)   Exercise Vital Sign    Days of Exercise per Week: 7 days    Minutes of Exercise per Session: 90 min  Stress: No Stress Concern Present (05/30/2023)   [employer] of Occupational Health - Occupational Stress Questionnaire    Feeling of Stress : Not at all  Social Connections: Moderately Integrated (05/30/2023)   Social Connection and Isolation Panel    Frequency of Communication with Friends and Family: Twice a week    Frequency of Social Gatherings with Friends and Family: Twice a week    Attends Religious Services: 1  to 4 times per year    Active Member of Clubs or Organizations: No    Attends Banker Meetings: Never    Marital Status: Married  Catering manager Violence: Not At Risk (05/30/2023)   Humiliation, Afraid, Rape, and Kick questionnaire    Fear of Current or Ex-Partner: No    Emotionally Abused: No    Physically Abused: No    Sexually Abused: No    Past Surgical History:  Procedure Laterality Date   CORONARY ANGIOPLASTY WITH STENT PLACEMENT  in 2002   HIP PINNING,CANNULATED Left 11/16/2020   Procedure: CANNULATED HIP PINNING;  Surgeon: Doll Skates, MD;  Location: [facility];   Service: Orthopedics;  Laterality: Left;  synthes, cannulated screws, hanna table, large c-arm,    can go as early as 2 p.m.   IR VERTEBROPLASTY LUMBAR BX INC UNI/BIL INC/INJECT/IMAGING  04/19/2023    Family History  Problem Relation Age of Onset   Liver disease Neg Hx    Esophageal cancer Neg Hx    Colon cancer Neg Hx     Allergies  Allergen Reactions   Alendronate Nausea Only   Amoxicillin-Pot Clavulanate Diarrhea   Denosumab Other (See Comments)    I just didn't like it    Gabapentin Other (See Comments)    Ineffective for sleep made me crazy   Hydrocodone -Acetaminophen  Other (See Comments)   Tramadol Other (See Comments)    Delirium     Current Outpatient Medications on File Prior to Visit  Medication Sig Dispense Refill   aspirin 81 MG chewable tablet Chew 81 mg by mouth daily.       atorvastatin  (LIPITOR) 20 MG tablet TAKE 1 TABLET BY MOUTH ONCE DAILY . APPOINTMENT REQUIRED FOR FUTURE REFILLS 30 tablet 0   celecoxib  (CELEBREX ) 100 MG capsule Take 1 capsule (100 mg total) by mouth 2 (two) times daily. 30 capsule 0   cyanocobalamin  (VITAMIN B12) 100 MCG tablet Take 100 mcg by mouth daily.     levothyroxine  (SYNTHROID ) 50 MCG tablet Take 1 tablet (50 mcg total) by mouth daily before breakfast. 90 tablet 0   metoprolol  succinate (TOPROL -XL) 50 MG 24 hr tablet Take 1 tablet (50 mg total) by mouth at bedtime. 90 tablet 0   Multiple Vitamins-Minerals (PRESERVISION AREDS 2 PO) Take 1 capsule by mouth daily.     acetaminophen  (TYLENOL  8 HOUR) 650 MG CR tablet 1 tab po q 8 hours prn pain (Patient not taking: Reported on 05/30/2023) 30 tablet 1   acetaminophen -codeine  (TYLENOL  #3) 300-30 MG tablet Take 1 tablet by mouth every 8 (eight) hours as needed for moderate pain (pain score 4-6). (Patient not taking: Reported on 05/30/2023) 10 tablet 0   Cholecalciferol  25 MCG (1000 UT) tablet Take 1,000 Units by mouth daily. (Patient not taking: Reported on 07/09/2023)     cyclobenzaprine   (FLEXERIL ) 5 MG tablet Take 1 tablet (5 mg total) by mouth at bedtime. (Patient not taking: Reported on 07/09/2023) 30 tablet 0   FEROSUL 325 (65 Fe) MG tablet Take 1 tablet by mouth once daily 30 tablet 0   lidocaine  (LIDODERM ) 5 % Place 1 patch onto the skin daily. Remove & Discard patch within 12 hours or as directed by MD (Patient not taking: Reported on 05/30/2023) 30 patch 0   losartan  (COZAAR ) 50 MG tablet Take 1 tablet (50 mg total) by mouth daily. 90 tablet 3   methylPREDNISolone  (MEDROL ) 4 MG tablet Standard 6 day taper dose (Patient not taking: Reported on 07/09/2023) 21  tablet 0   senna-docusate (SENOKOT-S) 8.6-50 MG tablet Take 1 tablet by mouth at bedtime as needed for mild constipation. (Patient not taking: Reported on 05/30/2023) 30 tablet 0   traZODone  (DESYREL ) 50 MG tablet TAKE 1/2 TO 1 (ONE-HALF TO ONE) TABLET BY MOUTH EVERY DAY AT BEDTIME (Patient not taking: Reported on 07/09/2023) 30 tablet 0   No current facility-administered medications on file prior to visit.    BP (!) 140/75   Temp 97.7 F (36.5 C)   Ht 4' 11 (1.499 m)   Wt 110 lb (49.9 kg)   BMI 22.22 kg/m        Objective:   Physical Exam  General Mental Status- Alert. General Appearance- Not in acute distress.   Skin General: Color- Normal Color. Moisture- Normal Moisture.  Neck  No JVD.  Chest and Lung Exam Auscultation: Breath Sounds:CTA  Cardiovascular Auscultation:Rythm- RRR Murmurs & Other Heart Sounds:Auscultation of the heart reveals- No Murmurs.  Abdomen Inspection:-Inspeection Normal. Palpation/Percussion:Note:No mass. Palpation and Percussion of the abdomen reveal- Non Tender, Non Distended + BS, no rebound or guarding.   Neurologic Cranial Nerve exam:- CN III-XII intact(No nystagmus), symmetric smile. Strength:- 5/5 equal and symmetric strength both upper and lower extremities.       Assessment & Plan:   Patient Instructions  Hypertension Blood pressure controlled with losartan   and metoprolol . - Continue losartan  and metoprolol . - Advise home blood pressure monitoring once or twice a week when relaxed, ensuring it remains below 140/90 mmHg.  Anemia Improving B12 levels with supplements. B12 dosage needs confirmation. - Order CBC, B12 level, and iron  panel. - Request confirmation of B12 dosage.  Hyperlipidemia Requires statin refill. Regular monitoring necessary. - Order lipid panel. - Refill statin prescription.  General Health Maintenance Prefers direct communication for updates. - Encourage use of MyChart or direct clinic communication for updates on lab results and medication dosages.  Follow up date to be determined after lab review.

## 2023-07-12 LAB — IRON,TIBC AND FERRITIN PANEL
%SAT: 45 % (ref 16–45)
Ferritin: 31 ng/mL (ref 16–288)
Iron: 140 ug/dL (ref 45–160)
TIBC: 310 ug/dL (ref 250–450)

## 2023-07-13 ENCOUNTER — Other Ambulatory Visit: Payer: Self-pay | Admitting: Medical

## 2023-07-23 ENCOUNTER — Other Ambulatory Visit: Payer: Self-pay | Admitting: Medical

## 2023-07-27 ENCOUNTER — Other Ambulatory Visit: Payer: Self-pay | Admitting: Medical

## 2023-08-22 ENCOUNTER — Other Ambulatory Visit: Payer: Self-pay | Admitting: Medical

## 2023-09-05 ENCOUNTER — Other Ambulatory Visit: Payer: Self-pay | Admitting: Medical

## 2023-09-09 ENCOUNTER — Ambulatory Visit
Admission: EM | Admit: 2023-09-09 | Discharge: 2023-09-09 | Disposition: A | Discharge status: Home / Self Care | Attending: Family Medicine | Admitting: Family Medicine

## 2023-09-09 ENCOUNTER — Ambulatory Visit (INDEPENDENT_AMBULATORY_CARE_PROVIDER_SITE_OTHER)

## 2023-09-09 DIAGNOSIS — M1712 Unilateral primary osteoarthritis, left knee: Secondary | ICD-10-CM | POA: Diagnosis not present

## 2023-09-09 DIAGNOSIS — M85862 Other specified disorders of bone density and structure, left lower leg: Secondary | ICD-10-CM | POA: Diagnosis not present

## 2023-09-09 DIAGNOSIS — M25562 Pain in left knee: Secondary | ICD-10-CM | POA: Diagnosis not present

## 2023-09-09 MED ORDER — PREDNISONE 10 MG PO TABS: 30.0000 mg | ORAL_TABLET | Freq: Every day | ORAL | 0 refills | Status: AC

## 2023-09-09 NOTE — ED Triage Notes (Signed)
 Pt c/o left knee pain x 1 week-denies injury-no pain meds PTA-NAD-limping gait

## 2023-09-09 NOTE — Discharge Instructions (Addendum)
 Will update you later today with your x-ray results. Use the Ace wrap during the day but not in your sleep. Start prednisone  for pain and inflammation. Try to follow up with an orthopedist group like [facility].

## 2023-09-09 NOTE — ED Provider Notes (Signed)
 [facility]  Note:  This document was prepared using Conservation officer, historic buildings and may include unintentional dictation errors.  MRN: [id number] DOB: [birth year]  Subjective:   Sandy Summers is a 83 y.o. female presenting for 1 week history of left knee pain, slight swelling.  No fever, fall, trauma, open wounds.  No history of gout.  Has a history of arthritis in other joints.  Creatinine clearance calculated at 41 mL/min using creatinine level from 07/11/2023.  Has hypertension, is on losartan  and metoprolol .  No current facility-administered medications for this encounter.  Current Outpatient Medications:    acetaminophen  (TYLENOL  8 HOUR) 650 MG CR tablet, 1 tab po q 8 hours prn pain (Patient not taking: Reported on 05/30/2023), Disp: 30 tablet, Rfl: 1   acetaminophen -codeine  (TYLENOL  #3) 300-30 MG tablet, Take 1 tablet by mouth every 8 (eight) hours as needed for moderate pain (pain score 4-6). (Patient not taking: Reported on 05/30/2023), Disp: 10 tablet, Rfl: 0   aspirin 81 MG chewable tablet, Chew 81 mg by mouth daily.  , Disp: , Rfl:    atorvastatin  (LIPITOR) 20 MG tablet, Take 1 tablet (20 mg total) by mouth daily., Disp: 90 tablet, Rfl: 3   celecoxib  (CELEBREX ) 100 MG capsule, Take 1 capsule (100 mg total) by mouth 2 (two) times daily., Disp: 30 capsule, Rfl: 0   Cholecalciferol  25 MCG (1000 UT) tablet, Take 1,000 Units by mouth daily. (Patient not taking: Reported on 07/09/2023), Disp: , Rfl:    cyanocobalamin  (VITAMIN B12) 100 MCG tablet, Take 100 mcg by mouth daily., Disp: , Rfl:    cyclobenzaprine  (FLEXERIL ) 5 MG tablet, Take 1 tablet (5 mg total) by mouth at bedtime. (Patient not taking: Reported on 07/09/2023), Disp: 30 tablet, Rfl: 0   ferrous sulfate  (FEROSUL) 325 (65 FE) MG tablet, Take 1 tablet (325 mg total) by mouth daily., Disp: 90 tablet, Rfl: 0   levothyroxine  (SYNTHROID ) 50 MCG tablet, TAKE 1 TABLET BY MOUTH ONCE DAILY BEFORE  BREAKFAST,  Disp: 90 tablet, Rfl: 0   lidocaine  (LIDODERM ) 5 %, Place 1 patch onto the skin daily. Remove & Discard patch within 12 hours or as directed by MD (Patient not taking: Reported on 05/30/2023), Disp: 30 patch, Rfl: 0   losartan  (COZAAR ) 50 MG tablet, Take 1 tablet (50 mg total) by mouth daily., Disp: 90 tablet, Rfl: 3   methylPREDNISolone  (MEDROL ) 4 MG tablet, Standard 6 day taper dose (Patient not taking: Reported on 07/09/2023), Disp: 21 tablet, Rfl: 0   metoprolol  succinate (TOPROL -XL) 50 MG 24 hr tablet, TAKE 1 TABLET BY MOUTH EVERY DAY AT BEDTIME, Disp: 90 tablet, Rfl: 0   Multiple Vitamins-Minerals (PRESERVISION AREDS 2 PO), Take 1 capsule by mouth daily., Disp: , Rfl:    senna-docusate (SENOKOT-S) 8.6-50 MG tablet, Take 1 tablet by mouth at bedtime as needed for mild constipation. (Patient not taking: Reported on 05/30/2023), Disp: 30 tablet, Rfl: 0   traZODone  (DESYREL ) 50 MG tablet, Take 0.5-1 tablets (25-50 mg total) by mouth at bedtime., Disp: 90 tablet, Rfl: 0   Allergies  Allergen Reactions   Alendronate Nausea Only   Amoxicillin-Pot Clavulanate Diarrhea   Denosumab Other (See Comments)    I just didn't like it    Gabapentin Other (See Comments)    Ineffective for sleep made me crazy   Hydrocodone -Acetaminophen  Other (See Comments)   Tramadol Other (See Comments)    Delirium     Past Medical History:  Diagnosis Date  Cholelithiasis    Hyperlipidemia    Hypertension    Thyroid  disease      Past Surgical History:  Procedure Laterality Date   CORONARY ANGIOPLASTY WITH STENT PLACEMENT  in 2002   HIP PINNING,CANNULATED Left 11/16/2020   Procedure: CANNULATED HIP PINNING;  Surgeon: Doll Skates, MD;  Location: [facility];  Service: Orthopedics;  Laterality: Left;  synthes, cannulated screws, hanna table, large c-arm,    can go as early as 2 p.m.   IR VERTEBROPLASTY LUMBAR BX INC UNI/BIL INC/INJECT/IMAGING  04/19/2023    Family History  Problem Relation Age of Onset   Liver  disease Neg Hx    Esophageal cancer Neg Hx    Colon cancer Neg Hx     Social History   Tobacco Use   Smoking status: Former    Current packs/day: 0.00    Types: Cigarettes    Quit date: 10/06/1995    Years since quitting: 27.9   Smokeless tobacco: Never  Vaping Use   Vaping status: Never Used  Substance Use Topics   Alcohol  use: No   Drug use: No    ROS   Objective:   Vitals: BP 131/82 (BP Location: Left Arm)   Pulse 79   Temp 97.6 F (36.4 C) (Oral)   Resp 20   SpO2 98%   Physical Exam Constitutional:      General: She is not in acute distress.    Appearance: Normal appearance. She is well-developed. She is not ill-appearing, toxic-appearing or diaphoretic.  HENT:     Head: Normocephalic and atraumatic.     Nose: Nose normal.     Mouth/Throat:     Mouth: Mucous membranes are moist.  Eyes:     General: No scleral icterus.       Right eye: No discharge.        Left eye: No discharge.     Extraocular Movements: Extraocular movements intact.  Cardiovascular:     Rate and Rhythm: Normal rate.  Pulmonary:     Effort: Pulmonary effort is normal.  Musculoskeletal:     Left knee: No swelling, deformity, effusion, erythema, ecchymosis, lacerations, bony tenderness or crepitus. Normal range of motion. Tenderness present over the patellar tendon. No medial joint line or lateral joint line tenderness. Normal alignment and normal patellar mobility.  Skin:    General: Skin is warm and dry.  Neurological:     General: No focal deficit present.     Mental Status: She is alert and oriented to person, place, and time.  Psychiatric:        Mood and Affect: Mood normal.        Behavior: Behavior normal.    Applied a 4 inch Ace wrap to the left knee.  Assessment and Plan :   PDMP not reviewed this encounter.  1. Acute pain of left knee    Radiology overread pending at discharge.  Recommended oral prednisone  course at 30 mg for 5 days given her history of heart  disease, creatinine clearance.  Follow-up with [facility]. Counseled patient on potential for adverse effects with medications prescribed/recommended today, ER and return-to-clinic precautions discussed, patient verbalized understanding.    Christopher Savannah, [place] 09/09/23 [phone]

## 2023-09-17 ENCOUNTER — Other Ambulatory Visit (HOSPITAL_COMMUNITY): Payer: Self-pay | Admitting: Sports Medicine

## 2023-09-17 DIAGNOSIS — M25562 Pain in left knee: Secondary | ICD-10-CM

## 2023-09-24 ENCOUNTER — Ambulatory Visit (HOSPITAL_BASED_OUTPATIENT_CLINIC_OR_DEPARTMENT_OTHER)
Admission: RE | Admit: 2023-09-24 | Discharge: 2023-09-24 | Disposition: A | Discharge status: Home / Self Care | Source: Ambulatory Visit | Attending: Sports Medicine | Admitting: Sports Medicine

## 2023-09-24 DIAGNOSIS — S82202A Unspecified fracture of shaft of left tibia, initial encounter for closed fracture: Secondary | ICD-10-CM | POA: Diagnosis not present

## 2023-09-24 DIAGNOSIS — R609 Edema, unspecified: Secondary | ICD-10-CM | POA: Diagnosis not present

## 2023-09-24 DIAGNOSIS — M25562 Pain in left knee: Secondary | ICD-10-CM | POA: Insufficient documentation

## 2023-09-24 DIAGNOSIS — S82142A Displaced bicondylar fracture of left tibia, initial encounter for closed fracture: Secondary | ICD-10-CM | POA: Diagnosis not present

## 2023-09-24 DIAGNOSIS — M2242 Chondromalacia patellae, left knee: Secondary | ICD-10-CM | POA: Diagnosis not present

## 2023-10-09 DIAGNOSIS — M25511 Pain in right shoulder: Secondary | ICD-10-CM | POA: Diagnosis not present

## 2023-10-09 DIAGNOSIS — M25562 Pain in left knee: Secondary | ICD-10-CM | POA: Diagnosis not present

## 2023-10-19 ENCOUNTER — Other Ambulatory Visit: Payer: Self-pay | Admitting: Medical

## 2023-10-25 ENCOUNTER — Other Ambulatory Visit: Payer: Self-pay | Admitting: Medical

## 2023-11-24 ENCOUNTER — Other Ambulatory Visit: Payer: Self-pay | Admitting: Medical

## 2023-12-02 ENCOUNTER — Other Ambulatory Visit: Payer: Self-pay | Admitting: Medical

## 2023-12-04 ENCOUNTER — Encounter: Payer: Self-pay | Admitting: Medical

## 2023-12-04 ENCOUNTER — Ambulatory Visit: Admitting: Medical

## 2023-12-04 VITALS — BP 130/70 | HR 73 | Temp 97.6°F | Resp 15 | Ht 59.0 in | Wt 113.0 lb

## 2023-12-04 DIAGNOSIS — I10 Essential (primary) hypertension: Secondary | ICD-10-CM | POA: Diagnosis not present

## 2023-12-04 DIAGNOSIS — I2510 Atherosclerotic heart disease of native coronary artery without angina pectoris: Secondary | ICD-10-CM

## 2023-12-04 DIAGNOSIS — E785 Hyperlipidemia, unspecified: Secondary | ICD-10-CM

## 2023-12-04 NOTE — Patient Instructions (Signed)
 Coronary artery disease with coronary stent Coronary artery disease with stent placed in 2002. LDL well-controlled at 51 mg/dL. No EKG changes since May 2024. On atorvastatin  and aspirin without side effects. Prefers not to add Zetia due to good LDL levels and polypharmacy concerns. - Continue atorvastatin  20 mg daily. - Continue aspirin daily. - Schedule follow-up in 3 months to re-evaluate lipid levels.  Dyslipidemia Well-controlled with atorvastatin . LDL below target at 51 mg/dL. Not interested in adding Zetia due to current control and medication burden concerns. - Continue atorvastatin  20 mg daily. - Re-evaluate lipid levels in 3 months.  Hypertension Managed with losartan  and metoprolol . Blood pressure improved from 150 mmHg to 130/70 mmHg. - Continue losartan  50 mg daily. - Continue metoprolol  50 mg daily. - Monitor blood pressure regularly.  Memory impairment (under evaluation) Reports difficulty with memory and concern about cognitive decline. - Consider referral to neurologist based on exam results.  Follow up 3 month or sooner if needed

## 2023-12-04 NOTE — Progress Notes (Signed)
 Subjective:    Patient ID: Sandy Summers, female    DOB: [birth year], 83 y.o.   MRN: [id number]  HPI   She takes atorvastatin  20 mg daily with good tolerance and follows lifestyle measures. Recent labs showed LDL 51. She is concerned about whether Zetia is needed given her current LDL and absence of side effects.  Cardiologist rx'd zetia but did not explain to pt reasoning behind. Pt declines to be on additional med.  She has coronary artery disease with a stent placed in 2002 and no prior myocardial infarction or stroke. Current medications are atorvastatin  20 mg daily, losartan  50 mg daily, metoprolol  XL 50 mg nightly, and daily aspirin, all without noted side effects.  She is worried about developing memory problems like her mother had, though she has not noticed cognitive issues on atorvastatin .       At end of exam pt states having troubling remembering things at time.     Review of Systems  Constitutional:  Negative for chills, fatigue and fever.  HENT:  Negative for congestion, ear pain and sinus pressure.   Respiratory:  Negative for choking and wheezing.   Cardiovascular:  Negative for chest pain and palpitations.  Gastrointestinal:  Negative for abdominal pain, constipation and rectal pain.  Genitourinary:  Positive for genital sores.  Musculoskeletal:  Negative for back pain, myalgias and neck stiffness.  Skin:  Negative for rash.  Neurological:  Negative for dizziness and light-headedness.  Hematological:  Negative for adenopathy.  Psychiatric/Behavioral:  Negative for behavioral problems and decreased concentration.     Past Medical History:  Diagnosis Date   Cholelithiasis    Hyperlipidemia    Hypertension    Thyroid  disease      Social History   Socioeconomic History   Marital status: Married    Spouse name: Not on file   Number of children: 4   Years of education: Not on file   Highest education level: Not on file  Occupational History    Occupation: retired  Tobacco Use   Smoking status: Former    Current packs/day: 0.00    Types: Cigarettes    Quit date: 10/06/1995    Years since quitting: 28.1   Smokeless tobacco: Never  Vaping Use   Vaping status: Never Used  Substance and Sexual Activity   Alcohol  use: No   Drug use: No   Sexual activity: Not Currently  Other Topics Concern   Not on file  Social History Narrative   Not on file   Social Drivers of Health   Financial Resource Strain: Low Risk  (04/20/2021)   Overall Financial Resource Strain (CARDIA)    Difficulty of Paying Living Expenses: Not hard at all  Food Insecurity: No Food Insecurity (05/30/2023)   Hunger Vital Sign    Worried About Running Out of Food in the Last Year: Never true    Ran Out of Food in the Last Year: Never true  Transportation Needs: No Transportation Needs (05/30/2023)   PRAPARE - Administrator, Civil Service (Medical): No    Lack of Transportation (Non-Medical): No  Physical Activity: Sufficiently Active (05/30/2023)   Exercise Vital Sign    Days of Exercise per Week: 7 days    Minutes of Exercise per Session: 90 min  Stress: No Stress Concern Present (05/30/2023)   [employer] of Occupational Health - Occupational Stress Questionnaire    Feeling of Stress : Not at all  Social Connections: Moderately Integrated (05/30/2023)  Social Advertising Account Executive    Frequency of Communication with Friends and Family: Twice a week    Frequency of Social Gatherings with Friends and Family: Twice a week    Attends Religious Services: 1 to 4 times per year    Active Member of [employer] or Organizations: No    Attends Banker Meetings: Never    Marital Status: Married  Catering Manager Violence: Not At Risk (05/30/2023)   Humiliation, Afraid, Rape, and Kick questionnaire    Fear of Current or Ex-Partner: No    Emotionally Abused: No    Physically Abused: No    Sexually Abused: No    Past Surgical  History:  Procedure Laterality Date   CORONARY ANGIOPLASTY WITH STENT PLACEMENT  in 2002   HIP PINNING,CANNULATED Left 11/16/2020   Procedure: CANNULATED HIP PINNING;  Surgeon: Doll Skates, MD;  Location: [facility];  Service: Orthopedics;  Laterality: Left;  synthes, cannulated screws, hanna table, large c-arm,    can go as early as 2 p.m.   IR VERTEBROPLASTY LUMBAR BX INC UNI/BIL INC/INJECT/IMAGING  04/19/2023    Family History  Problem Relation Age of Onset   Liver disease Neg Hx    Esophageal cancer Neg Hx    Colon cancer Neg Hx     Allergies  Allergen Reactions   Alendronate Nausea Only   Amoxicillin-Pot Clavulanate Diarrhea   Denosumab Other (See Comments)    I just didn't like it    Gabapentin Other (See Comments)    Ineffective for sleep made me crazy   Hydrocodone -Acetaminophen  Other (See Comments)   Tramadol Other (See Comments)    Delirium     Current Outpatient Medications on File Prior to Visit  Medication Sig Dispense Refill   aspirin 81 MG chewable tablet Chew 81 mg by mouth daily.       atorvastatin  (LIPITOR) 20 MG tablet Take 1 tablet (20 mg total) by mouth daily. 90 tablet 3   celecoxib  (CELEBREX ) 100 MG capsule Take 1 capsule (100 mg total) by mouth 2 (two) times daily. 30 capsule 0   cyanocobalamin  (VITAMIN B12) 100 MCG tablet Take 100 mcg by mouth daily.     FEROSUL 325 (65 Fe) MG tablet Take 1 tablet by mouth once daily 90 tablet 0   levothyroxine  (SYNTHROID ) 50 MCG tablet Take 1 tablet (50 mcg total) by mouth daily before breakfast. Need lab work 90 tablet 0   losartan  (COZAAR ) 50 MG tablet Take 1 tablet (50 mg total) by mouth daily. 90 tablet 3   metoprolol  succinate (TOPROL -XL) 50 MG 24 hr tablet Take 1 tablet (50 mg total) by mouth at bedtime. Take with or immediately following a meal 90 tablet 0   Multiple Vitamins-Minerals (PRESERVISION AREDS 2 PO) Take 1 capsule by mouth daily.     predniSONE  (DELTASONE ) 10 MG tablet Take 3 tablets (30 mg total) by  mouth daily with breakfast. 15 tablet 0   traZODone  (DESYREL ) 50 MG tablet TAKE 1/2 TO 1 (ONE-HALF TO ONE) TABLET BY MOUTH AT BEDTIME 90 tablet 0   acetaminophen  (TYLENOL  8 HOUR) 650 MG CR tablet 1 tab po q 8 hours prn pain (Patient not taking: Reported on 12/04/2023) 30 tablet 1   acetaminophen -codeine  (TYLENOL  #3) 300-30 MG tablet Take 1 tablet by mouth every 8 (eight) hours as needed for moderate pain (pain score 4-6). (Patient not taking: Reported on 12/04/2023) 10 tablet 0   Cholecalciferol  25 MCG (1000 UT) tablet Take 1,000 Units by mouth  daily. (Patient not taking: Reported on 12/04/2023)     cyclobenzaprine  (FLEXERIL ) 5 MG tablet Take 1 tablet (5 mg total) by mouth at bedtime. (Patient not taking: Reported on 12/04/2023) 30 tablet 0   lidocaine  (LIDODERM ) 5 % Place 1 patch onto the skin daily. Remove & Discard patch within 12 hours or as directed by MD (Patient not taking: Reported on 12/04/2023) 30 patch 0   senna-docusate (SENOKOT-S) 8.6-50 MG tablet Take 1 tablet by mouth at bedtime as needed for mild constipation. (Patient not taking: Reported on 12/04/2023) 30 tablet 0   No current facility-administered medications on file prior to visit.    BP 130/70   Pulse 73   Temp 97.6 F (36.4 C) (Oral)   Resp 15   Ht 4' 11 (1.499 m)   Wt 113 lb (51.3 kg)   SpO2 94%   BMI 22.82 kg/m         Objective:   Physical Exam  General- No acute distress. Pleasant patient. Neck- Full range of motion, no jvd Lungs- Clear, even and unlabored. Heart- regular rate and rhythm. Neurologic- CNII- XII grossly intact.       Assessment & Plan:   Coronary artery disease with coronary stent Coronary artery disease with stent placed in 2002. LDL well-controlled at 51 mg/dL. No EKG changes since May 2024. On atorvastatin  and aspirin without side effects. Prefers not to add Zetia due to good LDL levels and polypharmacy concerns. - Continue atorvastatin  20 mg daily. - Continue aspirin daily. - Schedule  follow-up in 3 months to re-evaluate lipid levels.  Dyslipidemia Well-controlled with atorvastatin . LDL below target at 51 mg/dL. Not interested in adding Zetia due to current control and medication burden concerns. - Continue atorvastatin  20 mg daily. - Re-evaluate lipid levels in 3 months.  Hypertension Managed with losartan  and metoprolol . Blood pressure improved from 150 mmHg to 130/70 mmHg. - Continue losartan  50 mg daily. - Continue metoprolol  50 mg daily. - Monitor blood pressure regularly.  Memory impairment (under evaluation) Reports difficulty with memory and concern about cognitive decline. - Consider referral to neurologist based on exam results.  Follow up 3 month or sooner if needed  [employer], PA-C

## 2023-12-09 ENCOUNTER — Ambulatory Visit
Admission: EM | Admit: 2023-12-09 | Discharge: 2023-12-09 | Disposition: A | Discharge status: Home / Self Care | Attending: Family Medicine | Admitting: Family Medicine

## 2023-12-09 DIAGNOSIS — S81811A Laceration without foreign body, right lower leg, initial encounter: Secondary | ICD-10-CM

## 2023-12-09 DIAGNOSIS — S81812A Laceration without foreign body, left lower leg, initial encounter: Secondary | ICD-10-CM | POA: Diagnosis not present

## 2023-12-09 NOTE — ED Provider Notes (Signed)
 [facility]    CSN: [id number] Arrival date & time: 12/09/23  1114      History   Chief Complaint Chief Complaint  Patient presents with   Fall    HPI Sandy Summers is a 83 y.o. female presents for skin tear. Pt states she fell going upstairs yesterday causing a skin tear to bilateral shins.  No head injury or LOC.  She states her son cleaned the area and dressed it but they have been oozing, left greater than right.  She is not on blood thinning medications.  She reports she is up-to-date on her tetanus vaccine.  No other concerns at this time.    Fall    Past Medical History:  Diagnosis Date   Cholelithiasis    Hyperlipidemia    Hypertension    Thyroid  disease     Patient Active Problem List   Diagnosis Date Noted   Aortic stenosis 09/11/2021   Closed fracture of one rib of left side 09/11/2021   Closed left hip fracture, initial encounter (HCC) 11/15/2020   CAD (coronary artery disease) 11/15/2020   Normocytic anemia 11/15/2020   Essential hypertension 11/15/2020   Hypothyroidism 11/15/2020   Senile osteoporosis 06/25/2019   Pancolonic diverticulosis 06/11/2017   Chronic insomnia 04/29/2015   Hyperlipidemia LDL goal <70 04/29/2015   Medicare annual wellness visit, subsequent 04/29/2015   Chest pain 07/09/2014   Dyslipidemia 07/09/2014    Past Surgical History:  Procedure Laterality Date   CORONARY ANGIOPLASTY WITH STENT PLACEMENT  in 2002   HIP PINNING,CANNULATED Left 11/16/2020   Procedure: CANNULATED HIP PINNING;  Surgeon: Doll Skates, MD;  Location: [facility];  Service: Orthopedics;  Laterality: Left;  synthes, cannulated screws, hanna table, large c-arm,    can go as early as 2 p.m.   IR VERTEBROPLASTY LUMBAR BX INC UNI/BIL INC/INJECT/IMAGING  04/19/2023    OB History   No obstetric history on file.      Home Medications    Prior to Admission medications   Medication Sig Start Date End Date Taking? Authorizing Provider   acetaminophen  (TYLENOL  8 HOUR) 650 MG CR tablet 1 tab po q 8 hours prn pain Patient not taking: Reported on 12/04/2023 03/28/23   Saguier, [place], PA-C  acetaminophen -codeine  (TYLENOL  #3) 300-30 MG tablet Take 1 tablet by mouth every 8 (eight) hours as needed for moderate pain (pain score 4-6). Patient not taking: Reported on 12/04/2023 03/25/23   Saguier, [place], PA-C  aspirin 81 MG chewable tablet Chew 81 mg by mouth daily.      [provider]  atorvastatin  (LIPITOR) 20 MG tablet Take 1 tablet (20 mg total) by mouth daily. 07/11/23   Saguier, [place], PA-C  celecoxib  (CELEBREX ) 100 MG capsule Take 1 capsule (100 mg total) by mouth 2 (two) times daily. 01/28/23   Cyndi Shaver, PA-C  Cholecalciferol  25 MCG (1000 UT) tablet Take 1,000 Units by mouth daily. Patient not taking: Reported on 12/04/2023    [provider]  cyanocobalamin  (VITAMIN B12) 100 MCG tablet Take 100 mcg by mouth daily.    [provider]  cyclobenzaprine  (FLEXERIL ) 5 MG tablet Take 1 tablet (5 mg total) by mouth at bedtime. Patient not taking: Reported on 12/04/2023 01/28/23   Cyndi Shaver, PA-C  FEROSUL 325 (65 Fe) MG tablet Take 1 tablet by mouth once daily 10/21/23   Saguier, [place], PA-C  levothyroxine  (SYNTHROID ) 50 MCG tablet Take 1 tablet (50 mcg total) by mouth daily before breakfast. Need lab work 11/25/23  Saguier, [place], PA-C  lidocaine  (LIDODERM ) 5 % Place 1 patch onto the skin daily. Remove & Discard patch within 12 hours or as directed by MD Patient not taking: Reported on 12/04/2023 03/15/23   Laurice Maude BROCKS, MD  losartan  (COZAAR ) 50 MG tablet Take 1 tablet (50 mg total) by mouth daily. 05/01/23   Saguier, [place], PA-C  metoprolol  succinate (TOPROL -XL) 50 MG 24 hr tablet Take 1 tablet (50 mg total) by mouth at bedtime. Take with or immediately following a meal 12/02/23   Saguier, [place], PA-C  Multiple Vitamins-Minerals (PRESERVISION AREDS 2 PO) Take 1 capsule by mouth daily.    [provider]  predniSONE  (DELTASONE ) 10 MG tablet Take 3 tablets (30 mg total) by mouth daily with breakfast. 09/09/23   Christopher Savannah, PA-C  senna-docusate (SENOKOT-S) 8.6-50 MG tablet Take 1 tablet by mouth at bedtime as needed for mild constipation. Patient not taking: Reported on 12/04/2023 11/18/20   Cindy Garnette POUR, MD  traZODone  (DESYREL ) 50 MG tablet TAKE 1/2 TO 1 (ONE-HALF TO ONE) TABLET BY MOUTH AT BEDTIME 10/25/23   Saguier, [place], PA-C    Family History Family History  Problem Relation Age of Onset   Liver disease Neg Hx    Esophageal cancer Neg Hx    Colon cancer Neg Hx     Social History Social History   Tobacco Use   Smoking status: Former    Current packs/day: 0.00    Types: Cigarettes    Quit date: 10/06/1995    Years since quitting: 28.1   Smokeless tobacco: Never  Vaping Use   Vaping status: Never Used  Substance Use Topics   Alcohol  use: No   Drug use: No     Allergies   Alendronate, Amoxicillin-pot clavulanate, Denosumab, Gabapentin, Hydrocodone -acetaminophen , and Tramadol   Review of Systems Review of Systems  Skin:  Positive for wound.     Physical Exam Triage Vital Signs ED Triage Vitals [12/09/23 1121]  Encounter Vitals Group     BP 126/77     Girls Systolic BP Percentile      Girls Diastolic BP Percentile      Boys Systolic BP Percentile      Boys Diastolic BP Percentile      Pulse Rate 84     Resp 15     Temp 97.6 F (36.4 C)     Temp src      SpO2 95 %     Weight      Height      Head Circumference      Peak Flow      Pain Score 0     Pain Loc      Pain Education      Exclude from Growth Chart    No data found.  Updated Vital Signs BP 126/77 (BP Location: Left Arm)   Pulse 84   Temp 97.6 F (36.4 C)   Resp 15   SpO2 95%   Visual Acuity Right Eye Distance:   Left Eye Distance:   Bilateral Distance:    Right Eye Near:   Left Eye Near:    Bilateral Near:     Physical Exam Vitals and nursing note reviewed.   Constitutional:      Appearance: Normal appearance.  HENT:     Head: Normocephalic and atraumatic.  Eyes:     Pupils: Pupils are equal, round, and reactive to light.  Cardiovascular:     Rate and Rhythm: Normal rate.  Pulmonary:  Effort: Pulmonary effort is normal.  Skin:    General: Skin is warm and dry.         Comments: 2 skin tears to bilateral anterior shins.  No swelling, erythema or warmth.  No active bleeding see photo.  Neurological:     General: No focal deficit present.     Mental Status: She is alert and oriented to person, place, and time.  Psychiatric:        Mood and Affect: Mood normal.        Behavior: Behavior normal.      UC Treatments / Results  Labs (all labs ordered are listed, but only abnormal results are displayed) Labs Reviewed - No data to display  EKG   Radiology No results found.  Procedures Procedures (including critical care time)  Medications Ordered in UC Medications - No data to display  Initial Impression / Assessment and Plan / UC Course  I have reviewed the triage vital signs and the nursing notes.  Pertinent labs & imaging results that were available during my care of the patient were reviewed by me and considered in my medical decision making (see chart for details).     Reviewed exam and injuries with patient.  No red flags.  Wounds were cleansed by nursing staff with bacitracin and nonadherent dressings applied.  Discussed wound care as well as signs and symptoms of infection and when to seek reevaluation.  PCP follow-up 2 to 3 days for recheck.  ER precautions reviewed. Final Clinical Impressions(s) / UC Diagnoses   Final diagnoses:  Skin tear of left lower leg without complication, initial encounter  Skin tear of right lower leg without complication, initial encounter     Discharge Instructions      Keep area clean and dry. Keep dressing in place and change daily unless it becomes soiled or saturated. Monitor  for any signs of infection such as redness, drainage, swelling, warmth, or fevers. Follow up with your PCP in 2-3 days for recheck. I hope you feel better soon!    ED Prescriptions   None    PDMP not reviewed this encounter.   Loreda Myla SAUNDERS, NP 12/09/23 1157

## 2023-12-09 NOTE — Discharge Instructions (Addendum)
 Keep area clean and dry. Keep dressing in place and change daily unless it becomes soiled or saturated. Monitor for any signs of infection such as redness, drainage, swelling, warmth, or fevers. Follow up with your PCP in 2-3 days for recheck. I hope you feel better soon!

## 2023-12-09 NOTE — ED Triage Notes (Signed)
 Pt present with bilateral lower leg extremity abrasions. Pt states she fell yesterday when going up concrete stairs.  Pt states she not taking blood thinners. Pt denies head injury.

## 2024-01-13 NOTE — Progress Notes (Signed)
 Sandy Summers                                          MRN: [id number]   01/13/2024   The [facility] Quality Team Specialist reviewed this patient medical record for the purposes of chart review for care gap closure. The following were reviewed: chart review for care gap closure-controlling blood pressure.    [facility] Quality Team

## 2024-01-14 ENCOUNTER — Other Ambulatory Visit: Payer: Self-pay | Admitting: Medical

## 2024-01-18 ENCOUNTER — Other Ambulatory Visit: Payer: Self-pay | Admitting: Medical

## 2024-03-03 ENCOUNTER — Ambulatory Visit: Admitting: Medical

## 2024-06-02 ENCOUNTER — Ambulatory Visit
